# Patient Record
Sex: Female | Born: 1970 | Race: Black or African American | Hispanic: No | Marital: Married | Smoking: Never smoker
Health system: Southern US, Community
[De-identification: ages and names within clinical notes are randomized; demographics above are authoritative.]

## PROBLEM LIST (undated history)

## (undated) DIAGNOSIS — H3551 Vitreoretinal dystrophy: Secondary | ICD-10-CM

## (undated) DIAGNOSIS — J310 Chronic rhinitis: Secondary | ICD-10-CM

## (undated) DIAGNOSIS — E079 Disorder of thyroid, unspecified: Secondary | ICD-10-CM

## (undated) DIAGNOSIS — R0600 Dyspnea, unspecified: Secondary | ICD-10-CM

## (undated) DIAGNOSIS — Z973 Presence of spectacles and contact lenses: Secondary | ICD-10-CM

## (undated) DIAGNOSIS — M069 Rheumatoid arthritis, unspecified: Secondary | ICD-10-CM

## (undated) DIAGNOSIS — J398 Other specified diseases of upper respiratory tract: Secondary | ICD-10-CM

## (undated) HISTORY — PX: TRACHEOSTOMY CLOSURE: SHX458

## (undated) HISTORY — DX: Dyspnea, unspecified: R06.00

## (undated) HISTORY — DX: Rheumatoid arthritis, unspecified: M06.9

## (undated) HISTORY — PX: OTHER SURGICAL HISTORY: SHX169

## (undated) HISTORY — DX: Chronic rhinitis: J31.0

## (undated) HISTORY — PX: TRACHEAL DILITATION: SHX5068

## (undated) HISTORY — PX: TRACHEOSTOMY: SUR1362

---

## 2002-09-30 ENCOUNTER — Inpatient Hospital Stay (HOSPITAL_COMMUNITY): Admission: AD | Admit: 2002-09-30 | Discharge: 2002-09-30 | Payer: Self-pay | Admitting: *Deleted

## 2002-10-29 ENCOUNTER — Encounter: Admission: RE | Admit: 2002-10-29 | Discharge: 2002-10-29 | Payer: Self-pay | Admitting: *Deleted

## 2002-10-30 ENCOUNTER — Ambulatory Visit (HOSPITAL_COMMUNITY): Admission: RE | Admit: 2002-10-30 | Discharge: 2002-10-30 | Payer: Self-pay | Admitting: *Deleted

## 2002-10-31 ENCOUNTER — Encounter: Admission: RE | Admit: 2002-10-31 | Discharge: 2002-10-31 | Payer: Self-pay | Admitting: Internal Medicine

## 2002-11-04 ENCOUNTER — Inpatient Hospital Stay (HOSPITAL_COMMUNITY): Admission: AD | Admit: 2002-11-04 | Discharge: 2002-11-04 | Payer: Self-pay | Admitting: *Deleted

## 2002-11-26 ENCOUNTER — Encounter: Admission: RE | Admit: 2002-11-26 | Discharge: 2002-11-26 | Payer: Self-pay | Admitting: *Deleted

## 2002-12-09 ENCOUNTER — Encounter: Admission: RE | Admit: 2002-12-09 | Discharge: 2002-12-09 | Payer: Self-pay | Admitting: Internal Medicine

## 2002-12-10 ENCOUNTER — Encounter: Admission: RE | Admit: 2002-12-10 | Discharge: 2002-12-10 | Payer: Self-pay | Admitting: *Deleted

## 2002-12-10 ENCOUNTER — Ambulatory Visit (HOSPITAL_COMMUNITY): Admission: RE | Admit: 2002-12-10 | Discharge: 2002-12-10 | Payer: Self-pay | Admitting: *Deleted

## 2002-12-15 ENCOUNTER — Encounter: Admission: RE | Admit: 2002-12-15 | Discharge: 2002-12-15 | Payer: Self-pay | Admitting: *Deleted

## 2002-12-25 ENCOUNTER — Encounter: Admission: RE | Admit: 2002-12-25 | Discharge: 2002-12-25 | Payer: Self-pay | Admitting: Family Medicine

## 2002-12-25 ENCOUNTER — Ambulatory Visit (HOSPITAL_COMMUNITY): Admission: RE | Admit: 2002-12-25 | Discharge: 2002-12-25 | Payer: Self-pay | Admitting: *Deleted

## 2002-12-25 ENCOUNTER — Encounter: Admission: RE | Admit: 2002-12-25 | Discharge: 2002-12-25 | Payer: Self-pay | Admitting: *Deleted

## 2003-01-08 ENCOUNTER — Encounter: Admission: RE | Admit: 2003-01-08 | Discharge: 2003-01-08 | Payer: Self-pay | Admitting: Family Medicine

## 2003-01-09 ENCOUNTER — Encounter: Payer: Self-pay | Admitting: *Deleted

## 2003-01-09 ENCOUNTER — Ambulatory Visit (HOSPITAL_COMMUNITY): Admission: RE | Admit: 2003-01-09 | Discharge: 2003-01-09 | Payer: Self-pay | Admitting: *Deleted

## 2003-01-20 ENCOUNTER — Inpatient Hospital Stay (HOSPITAL_COMMUNITY): Admission: AD | Admit: 2003-01-20 | Discharge: 2003-01-20 | Payer: Self-pay | Admitting: *Deleted

## 2003-01-22 ENCOUNTER — Ambulatory Visit (HOSPITAL_COMMUNITY): Admission: RE | Admit: 2003-01-22 | Discharge: 2003-01-22 | Payer: Self-pay | Admitting: *Deleted

## 2003-01-22 ENCOUNTER — Encounter: Admission: RE | Admit: 2003-01-22 | Discharge: 2003-01-22 | Payer: Self-pay | Admitting: *Deleted

## 2003-02-05 ENCOUNTER — Ambulatory Visit (HOSPITAL_COMMUNITY): Admission: RE | Admit: 2003-02-05 | Discharge: 2003-02-05 | Payer: Self-pay | Admitting: *Deleted

## 2003-02-11 ENCOUNTER — Encounter: Admission: RE | Admit: 2003-02-11 | Discharge: 2003-02-11 | Payer: Self-pay | Admitting: Internal Medicine

## 2003-02-19 ENCOUNTER — Encounter: Admission: RE | Admit: 2003-02-19 | Discharge: 2003-02-19 | Payer: Self-pay | Admitting: *Deleted

## 2003-03-05 ENCOUNTER — Encounter: Admission: RE | Admit: 2003-03-05 | Discharge: 2003-03-05 | Payer: Self-pay | Admitting: *Deleted

## 2003-03-12 ENCOUNTER — Ambulatory Visit (HOSPITAL_COMMUNITY): Admission: RE | Admit: 2003-03-12 | Discharge: 2003-03-12 | Payer: Self-pay | Admitting: *Deleted

## 2003-03-12 ENCOUNTER — Encounter: Admission: RE | Admit: 2003-03-12 | Discharge: 2003-03-12 | Payer: Self-pay | Admitting: *Deleted

## 2003-03-26 ENCOUNTER — Encounter: Admission: RE | Admit: 2003-03-26 | Discharge: 2003-03-26 | Payer: Self-pay | Admitting: Family Medicine

## 2003-04-09 ENCOUNTER — Encounter: Admission: RE | Admit: 2003-04-09 | Discharge: 2003-04-09 | Payer: Self-pay | Admitting: *Deleted

## 2003-04-13 ENCOUNTER — Inpatient Hospital Stay (HOSPITAL_COMMUNITY): Admission: AD | Admit: 2003-04-13 | Discharge: 2003-04-13 | Payer: Self-pay | Admitting: Obstetrics & Gynecology

## 2003-04-21 ENCOUNTER — Inpatient Hospital Stay (HOSPITAL_COMMUNITY): Admission: AD | Admit: 2003-04-21 | Discharge: 2003-04-21 | Payer: Self-pay | Admitting: Obstetrics & Gynecology

## 2003-04-23 ENCOUNTER — Encounter: Admission: RE | Admit: 2003-04-23 | Discharge: 2003-04-23 | Payer: Self-pay | Admitting: *Deleted

## 2003-04-24 ENCOUNTER — Inpatient Hospital Stay (HOSPITAL_COMMUNITY): Admission: AD | Admit: 2003-04-24 | Discharge: 2003-04-27 | Payer: Self-pay | Admitting: Family Medicine

## 2003-05-26 ENCOUNTER — Encounter: Admission: RE | Admit: 2003-05-26 | Discharge: 2003-05-26 | Payer: Self-pay | Admitting: Internal Medicine

## 2003-08-27 ENCOUNTER — Encounter: Admission: RE | Admit: 2003-08-27 | Discharge: 2003-08-27 | Payer: Self-pay | Admitting: Internal Medicine

## 2003-09-07 ENCOUNTER — Encounter: Admission: RE | Admit: 2003-09-07 | Discharge: 2003-09-07 | Payer: Self-pay | Admitting: Internal Medicine

## 2003-10-12 ENCOUNTER — Encounter: Admission: RE | Admit: 2003-10-12 | Discharge: 2003-10-12 | Payer: Self-pay | Admitting: Internal Medicine

## 2003-11-03 ENCOUNTER — Encounter: Admission: RE | Admit: 2003-11-03 | Discharge: 2004-02-01 | Payer: Self-pay | Admitting: Internal Medicine

## 2003-12-19 ENCOUNTER — Emergency Department (HOSPITAL_COMMUNITY): Admission: EM | Admit: 2003-12-19 | Discharge: 2003-12-19 | Payer: Self-pay | Admitting: Emergency Medicine

## 2004-02-02 ENCOUNTER — Encounter: Admission: RE | Admit: 2004-02-02 | Discharge: 2004-05-02 | Payer: Self-pay | Admitting: Internal Medicine

## 2004-02-25 ENCOUNTER — Ambulatory Visit: Payer: Self-pay | Admitting: Internal Medicine

## 2005-08-29 ENCOUNTER — Emergency Department (HOSPITAL_COMMUNITY): Admission: EM | Admit: 2005-08-29 | Discharge: 2005-08-29 | Payer: Self-pay | Admitting: Emergency Medicine

## 2006-08-07 ENCOUNTER — Emergency Department (HOSPITAL_COMMUNITY): Admission: EM | Admit: 2006-08-07 | Discharge: 2006-08-07 | Payer: Self-pay | Admitting: Family Medicine

## 2007-02-26 ENCOUNTER — Emergency Department (HOSPITAL_COMMUNITY): Admission: EM | Admit: 2007-02-26 | Discharge: 2007-02-26 | Payer: Self-pay | Admitting: Emergency Medicine

## 2007-08-05 ENCOUNTER — Encounter: Admission: RE | Admit: 2007-08-05 | Discharge: 2007-08-05 | Payer: Self-pay | Admitting: Obstetrics and Gynecology

## 2007-10-05 ENCOUNTER — Emergency Department (HOSPITAL_COMMUNITY): Admission: EM | Admit: 2007-10-05 | Discharge: 2007-10-05 | Payer: Self-pay | Admitting: Family Medicine

## 2007-10-22 ENCOUNTER — Encounter: Payer: Self-pay | Admitting: Internal Medicine

## 2007-10-22 ENCOUNTER — Ambulatory Visit (HOSPITAL_COMMUNITY): Admission: RE | Admit: 2007-10-22 | Discharge: 2007-10-22 | Payer: Self-pay | Admitting: Internal Medicine

## 2007-11-27 ENCOUNTER — Encounter: Admission: RE | Admit: 2007-11-27 | Discharge: 2007-11-27 | Payer: Self-pay | Admitting: Rheumatology

## 2009-04-06 ENCOUNTER — Ambulatory Visit: Payer: Self-pay | Admitting: Internal Medicine

## 2009-04-06 DIAGNOSIS — M069 Rheumatoid arthritis, unspecified: Secondary | ICD-10-CM | POA: Insufficient documentation

## 2009-04-06 DIAGNOSIS — R0602 Shortness of breath: Secondary | ICD-10-CM | POA: Insufficient documentation

## 2009-04-06 LAB — CONVERTED CEMR LAB
CO2: 26 meq/L (ref 19–32)
Chloride: 106 meq/L (ref 96–112)
Creatinine, Ser: 0.6 mg/dL (ref 0.4–1.2)
Glucose, Bld: 94 mg/dL (ref 70–99)
Potassium: 3.8 meq/L (ref 3.5–5.1)

## 2009-04-09 ENCOUNTER — Ambulatory Visit: Payer: Self-pay | Admitting: Cardiology

## 2009-04-10 DIAGNOSIS — J45909 Unspecified asthma, uncomplicated: Secondary | ICD-10-CM | POA: Insufficient documentation

## 2009-04-21 ENCOUNTER — Telehealth: Payer: Self-pay | Admitting: Internal Medicine

## 2009-04-29 ENCOUNTER — Ambulatory Visit: Payer: Self-pay | Admitting: Internal Medicine

## 2009-04-30 ENCOUNTER — Telehealth (INDEPENDENT_AMBULATORY_CARE_PROVIDER_SITE_OTHER): Payer: Self-pay | Admitting: *Deleted

## 2009-11-09 ENCOUNTER — Inpatient Hospital Stay (HOSPITAL_COMMUNITY): Admission: AD | Admit: 2009-11-09 | Discharge: 2009-11-09 | Payer: Self-pay | Admitting: Obstetrics & Gynecology

## 2009-11-09 ENCOUNTER — Encounter: Payer: Self-pay | Admitting: Internal Medicine

## 2009-11-09 DIAGNOSIS — O9989 Other specified diseases and conditions complicating pregnancy, childbirth and the puerperium: Secondary | ICD-10-CM

## 2009-11-09 DIAGNOSIS — R05 Cough: Secondary | ICD-10-CM

## 2009-11-09 DIAGNOSIS — M940 Chondrocostal junction syndrome [Tietze]: Secondary | ICD-10-CM

## 2009-11-09 DIAGNOSIS — O99891 Other specified diseases and conditions complicating pregnancy: Secondary | ICD-10-CM

## 2009-11-09 DIAGNOSIS — R059 Cough, unspecified: Secondary | ICD-10-CM

## 2009-11-18 ENCOUNTER — Encounter: Admission: RE | Admit: 2009-11-18 | Discharge: 2009-11-18 | Payer: Self-pay | Admitting: Obstetrics and Gynecology

## 2009-12-07 ENCOUNTER — Encounter: Payer: Self-pay | Admitting: Internal Medicine

## 2010-01-21 ENCOUNTER — Telehealth: Payer: Self-pay | Admitting: Internal Medicine

## 2010-01-21 ENCOUNTER — Ambulatory Visit: Payer: Self-pay | Admitting: Internal Medicine

## 2010-01-27 ENCOUNTER — Encounter: Payer: Self-pay | Admitting: Internal Medicine

## 2010-01-27 ENCOUNTER — Telehealth (INDEPENDENT_AMBULATORY_CARE_PROVIDER_SITE_OTHER): Payer: Self-pay | Admitting: *Deleted

## 2010-01-31 ENCOUNTER — Ambulatory Visit: Payer: Self-pay | Admitting: Internal Medicine

## 2010-02-10 ENCOUNTER — Encounter: Payer: Self-pay | Admitting: Internal Medicine

## 2010-02-11 ENCOUNTER — Ambulatory Visit: Payer: Self-pay | Admitting: Internal Medicine

## 2010-02-11 ENCOUNTER — Telehealth (INDEPENDENT_AMBULATORY_CARE_PROVIDER_SITE_OTHER): Payer: Self-pay | Admitting: *Deleted

## 2010-02-11 DIAGNOSIS — J310 Chronic rhinitis: Secondary | ICD-10-CM | POA: Insufficient documentation

## 2010-02-11 DIAGNOSIS — J386 Stenosis of larynx: Secondary | ICD-10-CM | POA: Insufficient documentation

## 2010-02-23 ENCOUNTER — Encounter: Payer: Self-pay | Admitting: Internal Medicine

## 2010-02-28 ENCOUNTER — Telehealth (INDEPENDENT_AMBULATORY_CARE_PROVIDER_SITE_OTHER): Payer: Self-pay | Admitting: *Deleted

## 2010-03-23 ENCOUNTER — Ambulatory Visit: Payer: Self-pay | Admitting: Internal Medicine

## 2010-03-28 ENCOUNTER — Encounter: Payer: Self-pay | Admitting: Internal Medicine

## 2010-04-03 ENCOUNTER — Inpatient Hospital Stay (HOSPITAL_COMMUNITY): Admission: AD | Admit: 2010-04-03 | Discharge: 2010-04-08 | Payer: Self-pay | Admitting: Obstetrics & Gynecology

## 2010-04-03 ENCOUNTER — Ambulatory Visit: Payer: Self-pay | Admitting: Internal Medicine

## 2010-04-06 ENCOUNTER — Encounter (INDEPENDENT_AMBULATORY_CARE_PROVIDER_SITE_OTHER): Payer: Self-pay | Admitting: Obstetrics and Gynecology

## 2010-04-11 ENCOUNTER — Telehealth (INDEPENDENT_AMBULATORY_CARE_PROVIDER_SITE_OTHER): Payer: Self-pay | Admitting: *Deleted

## 2010-04-15 ENCOUNTER — Ambulatory Visit: Payer: Self-pay | Admitting: Internal Medicine

## 2010-05-12 ENCOUNTER — Ambulatory Visit (HOSPITAL_COMMUNITY)
Admission: RE | Admit: 2010-05-12 | Discharge: 2010-05-12 | Payer: Self-pay | Source: Home / Self Care | Attending: Otolaryngology | Admitting: Otolaryngology

## 2010-05-20 ENCOUNTER — Ambulatory Visit: Payer: Self-pay | Admitting: Internal Medicine

## 2010-06-21 NOTE — Assessment & Plan Note (Signed)
Summary: ASTHMA///kp   Primary Provider/Referring Provider:  Allyne Gee  CC:  Asthma flare up.  History of Present Illness: April 29, 2009- Question upper Airway Obstruction, asthma, RA One episode wheezey with anxiety- her rescue inhaler was definite help. Just restarted singulair for scratchy throat. Has an annoying cough ongoing, mostly at night, but no acute infection. Cough wakes her every night- takes drink of water. Not aware of reflux and the right chest pains mentioned last time have resolved. CT neck was of neck for anatomic obstruction. Lungs were clear, but axillary adenopathy noted. She denies reflux, choking or strangling with food or drink. Got flu vax. Repeat PFT on return, to look again for squared FV loops?  January 21, 2010- ? Upper airway obstruction, Asthma, RA Now [redacted] weeks pregnant. Acute visit - has been struggling with her breathing for 3 months. Went to ER 2 months ago at [facility]. Did CXR then- Again discused normal CT chest and neck from last Fall. She saw Dr Narda Bonds who did laryngoscopy- no bx. He ?'d sarcoid. "Saw swelling", recommended nasal saline rinse. Ran out of Qvar and Singulair - made no difference. Weather changes,  strong odors irritate her nose and throat and make breathing worse. Hot weather was unbearable. She feels the heaviness at lower right sternum. No dysphagia. does get hoarse. Denies heart burn or reflux. Ok lying down with no change in her breathing, but in the evening she does keep a barking cough that interferes with sleep but is nonproductive and nonpositional. Proair does help.     Asthma History    Initial Asthma Severity Rating:    Age range: 12+ years    Symptoms: 0-2 days/week    Nighttime Awakenings: 3-4/month    Interferes w/ normal activity: minor limitations    SABA use (not for EIB): >2 days/week but not >1X/day    Asthma Severity Assessment: Mild Persistent   Preventive Screening-Counseling &  Management  Alcohol-Tobacco     Smoking Status: never  Current Medications (verified): 1)  Synthroid 137 Mcg Tabs (Levothyroxine Sodium) .... Take 1 By Mouth Once Daily 2)  Proair Hfa 108 (90 Base) Mcg/act Aers (Albuterol Sulfate) .... 2 Puffs Four Times A Day As Needed Rescue 3)  Albuterol Sulfate (2.5 Mg/32ml) 0.083% Nebu (Albuterol Sulfate) .Marland Kitchen.. 1 Vial Four Times A Day As Needed 4)  Claritin 10 Mg Tabs (Loratadine) .... Take 1 By Mouth Once Daily 5)  Ayr Saline Nasal Rinse 1.57 Gm Pack (Sodium Chloride-Sodium Bicarb) .... Use As Directed 6)  Vicks Vaporub 5.2-1.2-2.8 % Crea (Camphor-Eucalyptus-Menthol) .... Use As Directed  Allergies (verified): 1)  ! 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  Works- Teaching laboratory technician  Risk Factors: Smoking Status: never (01/21/2010)  Review of Systems      See HPI       The patient complains of hoarseness, dyspnea on exertion, and prolonged cough.  The patient denies anorexia, fever, weight loss, weight gain, vision loss, decreased hearing, chest pain, syncope, peripheral edema, headaches, hemoptysis, abdominal pain, and severe indigestion/heartburn.    Vital Signs:  Patient profile:   40 year old female Height:      65 inches Weight:      169.25 pounds BMI:  28.27 O2 Sat:      98 % on Room air Pulse rate:   99 / minute BP sitting:   112 / 64  (right arm) Cuff size:   regular  Vitals Entered By: Reynaldo Minium CMA (January 21, 2010 11:23 AM)  O2 Flow:  Room air CC: Asthma flare up   Physical Exam  Additional Exam:  General: A/Ox3; pleasant and cooperative, NAD, SKIN: no rash,  lesions NODES: palpable nodes right> left axilla HEENT: [place]/AT, EOM- WNL, Conjuctivae- clear, PERRLA, TM-WNL, Nose- clear, Throat- clear and wnl, Malampatti II-III, normal voice or minimally hoarse. Inspiratory dragging respiratory qualityduring inspiration. NECK: Supple w/ fair ROM, JVD- none, normal carotid impulses w/o bruits Thyroid- not enlarged,  CHEST: Clear to P&A HEART: RRR, no m/g/r heard ABDOMEN: Soft and nl;  ZOX:WRUE, nl pulses, no edema  NEURO: Grossly intact to observation      Impression & Recommendations:  Problem # 1:  ASTHMA (ICD-493.90) She responds to Proair, indicating there is some reversibility, but I still believe most of her problem is not asthma. Dr Ezzard Standing didn't see a rheumatoid change to her cords, but brought up issue of sarcoid so we will send an ACE level. Will do an office spirometry to look at loop. Will get ONOx. Add Alvesco 80. She will probably need bronchoscopy after delivery to look for tracheal narrowing.  Problem # 2:  DYSPNEA (ICD-786.05) Upper airway obstruction at upper trachea may be present. ENT exam didn't recognize arytenoid arthritis or laryngeal stenosis.  Medications Added to Medication List This Visit: 1)  Albuterol Sulfate (2.5 Mg/55ml) 0.083% Nebu (Albuterol sulfate) .Marland Kitchen.. 1 vial four times a day as needed 2)  Claritin 10 Mg Tabs (Loratadine) .... Take 1 by mouth once daily 3)  Ayr Saline Nasal Rinse 1.57 Gm Pack (Sodium chloride-sodium bicarb) .... Use as directed 4)  Vicks Vaporub 5.2-1.2-2.8 % Crea (Camphor-eucalyptus-menthol) .... Use as directed 5)  Alvesco 80 Mcg/act Aers (Ciclesonide) .... 2 puffs and rinse mouth twice daily.  Other Orders: Nebulizer Tx (45409) T-Angiotensin i-Converting Enzyme ([id number]) Est. Patient Level IV (56213) DME Referral (DME)  Patient Instructions: 1)  We will ask record from Dr Narda Bonds 2)  CC Dr Ezzard Standing 3)        [facility]ilton [facility] 4)  Lab 5)  [facility] will set up overnight  oximetry 6)  sample/ script Alvesco 80: 2 puffs and rinse mouth, twice daily every day. Continue to use Proair as a rescue inhaler 7)  Please schedule a follow-up appointment in 3 weeks. Prescriptions: ALVESCO 80 MCG/ACT AERS (CICLESONIDE) 2 puffs and rinse mouth twice daily.  #1 x prn   Entered and Authorized by:   Waymon Budge MD   Signed by:   Waymon Budge MD on 01/21/2010   Method used:   Print then Give to Patient   RxID:   [id number]      Medication Administration  Medication # 1:    Medication: Xopenex 1.25mg     Diagnosis: ASTHMA (ICD-493.90)    Dose: 1 vial     Route: inhaled    Exp Date: 01/2010    Lot #: [id number]    Mfr: Sepracor    Patient tolerated medication without complications    Given by: Reynaldo Minium CMA (January 21, 2010 11:26 AM)  Orders Added: 1)  Nebulizer Tx [02725] 2)  T-Angiotensin i-Converting Enzyme [[id number]] 3)  Est. Patient Level IV [25956] 4)  DME Referral [DME]

## 2010-06-21 NOTE — Letter (Signed)
Summary: Out of Work  [employer]  [address]   [place], [place] [address]   Phone: [phone]  Fax: [phone]    February 11, 2010   Employee:  Laurie Dyer    To Whom It May Concern:   For Medical reasons, please excuse the above named employee for being late to work.  Start:   February 11, 2010  End:   February 11, 2010  Laurie Dyer is able to return to work today. If you need additional information, please feel free to contact our office.         Sincerely,     Jason Coop

## 2010-06-21 NOTE — Procedures (Signed)
Summary: Oximetry/[facility]  Oximetry/[facility]   Imported By: Sherian Rein 02/10/2010 11:33:37  _____________________________________________________________________  External Attachment:    Type:   Image     Comment:   External Document

## 2010-06-21 NOTE — Assessment & Plan Note (Signed)
Summary: rov 3 wks ///kp   Primary Provider/Referring Provider:  Allyne Gee  CC:  3 week follow up visit-asthma; Still increased wheezing.Marland Kitchen  History of Present Illness:  January 21, 2010- ? Upper airway obstruction, Asthma, RA Now [redacted] weeks pregnant. Acute visit - has been struggling with her breathing for 3 months. Went to ER 2 months ago at [facility]. Did CXR then- Again discused normal CT chest and neck from last Fall. She saw Dr Narda Bonds who did laryngoscopy- no bx. He ?'d sarcoid. "Saw swelling", recommended nasal saline rinse. Ran out of Qvar and Singulair - made no difference. Weather changes,  strong odors irritate her nose and throat and make breathing worse. Hot weather was unbearable. She feels the heaviness at lower right sternum. No dysphagia. does get hoarse. Denies heart burn or reflux. Ok lying down with no change in her breathing, but in the evening she does keep a barking cough that interferes with sleep but is nonproductive and nonpositional. Proair does help.  February 11, 2010- Upper airway obstruction, Asthma, RA.................Marland Kitchenhusband here I reviewed her CT's with Dr Karin Golden in radiology, recognizing apparently normal cords, but circumferential narrowing of the subglottic trachea. He doubted neoplasm. Sarcoidoss would be possible.  Some nights she wakes coughing then gets smothered. Comfortable sitting quietly.  She is using the rescue inhaler now about three times a day and it does give definite relief, especially some days when "ragweed is up", Nose is always itchy with sneezing.  Around 2008 she was living in apt with mold and a lot of trees. That was when she began noting rhinitis and chest  tightness. No hx of airway intubation.  Now 28 weeks- Dr Henderson Cloud. PFTs- confirm fixed upper airway obstruction. ONOX was normal. I reviewed these results, her negative ACE level, and Dr Allene Pyo observations from 12/07/09 rhinoscopy/ laryngoscopy noting mucosal crusting in the  nose, normal cords, subglottic swelling.    Asthma History    Asthma Control Assessment:    Age range: 12+ years    Symptoms: throughout the day    Nighttime Awakenings: 0-2/month    Interferes w/ normal activity: no limitations    SABA use (not for EIB): >2 days/week    Asthma Control Assessment: Very Poorly Controlled   Preventive Screening-Counseling & Management  Alcohol-Tobacco     Smoking Status: never  Current Medications (verified): 1)  Synthroid 137 Mcg Tabs (Levothyroxine Sodium) .... Take 1 By Mouth Once Daily 2)  Proair Hfa 108 (90 Base) Mcg/act Aers (Albuterol Sulfate) .... 2 Puffs Four Times A Day As Needed Rescue 3)  Albuterol Sulfate (2.5 Mg/47ml) 0.083% Nebu (Albuterol Sulfate) .Marland Kitchen.. 1 Vial Four Times A Day As Needed 4)  Claritin 10 Mg Tabs (Loratadine) .... Take 1 By Mouth Once Daily 5)  Ayr Saline Nasal Rinse 1.57 Gm Pack (Sodium Chloride-Sodium Bicarb) .... Use As Directed 6)  Vicks Vaporub 5.2-1.2-2.8 % Crea (Camphor-Eucalyptus-Menthol) .... Use As Directed 7)  Alvesco 80 Mcg/act Aers (Ciclesonide) .... 2 Puffs and Rinse Mouth Twice Daily.  Allergies (verified): 1)  ! Sulfa  Past History:  Past Medical History: Last updated: 04/06/2009 ASTHMA (ICD-493.90) ARTHRITIS, RHEUMATOID (ICD-714.0) DYSPNEA (ICD-786.05) Hypothyroid s/p RAI  Family History: Last updated: 04/06/2009 Grandmother- had asthma  Social History: Last updated: 04/06/2009 married, daughter Patient never smoked.  Works- Teaching laboratory technician  Risk Factors: Smoking Status: never (02/11/2010)  Past Surgical History: Rhinoscopy, laryngoscopy -Dr Narda Bonds 12/07/09  Review of Systems      See HPI  The patient complains of weight gain, dyspnea on exertion, and prolonged cough.  The patient denies anorexia, fever, weight loss, vision loss, decreased hearing, hoarseness, chest pain, syncope, peripheral edema, headaches, hemoptysis, abdominal pain, melena, severe  indigestion/heartburn, suspicious skin lesions, transient blindness, abnormal bleeding, enlarged lymph nodes, and angioedema.    Vital Signs:  Patient profile:   40 year old female Height:      65 inches Weight:      172 pounds BMI:     28.73 O2 Sat:      98 % on Room air Pulse rate:   102 / minute BP sitting:   142 / 64  (left arm) Cuff size:   regular  Vitals Entered By: Reynaldo Minium CMA (February 11, 2010 9:10 AM)  O2 Flow:  Room air CC: 3 week follow up visit-asthma; Still increased wheezing.   Physical Exam  Additional Exam:  General: A/Ox3; pleasant and cooperative, NAD, Pregnant SKIN: no rash, lesions NODES: palpable nodes right> left axilla HEENT: [place]/AT, EOM- WNL, Conjuctivae- clear, PERRLA, TM-WNL, Nose- clear, Throat- clear and wnl, Malampatti II-III, normal voice or minimally hoarse. 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 Noisy respiration with exertion, but normal oxygenation.  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 We discused options. I will speak to her gyn. Meanwhile, we will send her back to Dr Ezzard Standing to consider a nasal mucosal bx which may suppoort dx of sarcoid.   I  today called and discussed with Dr Henderson Cloud: Can expect noisy respiration and increased work of breathing during labor. If necesary, I would expect Anesthesiology  to be able to bypass her airway narrowing with an endotracheal tube, if necessary, as possibly for a C-section. If we had a nasal mucosal bx c/w sarcoid, then we could consider initiating steroid  therapy to see if that would shrink the soft tissue swelling in the trachea. Dr Henderson Cloud idicates this therapy could be used at this pint in pregnancy if needed. I would not expect quick response.  Problem # 2:  ASTHMA (ICD-493.90) Her inhalers give relief, so there is some component of bronchospasm. I've told  her she may continue to use her aerosol meds as directed.    Problem # 3:  CHRONIC RHINITIS (ICD-472.0)  Some persistent stuffiness and crusting. This may be atopic or sarcoid. She can use nasal strips and saline for now. Hopefully a nasal mucosal bx would show granulomas so at least we would have a dx pertinent to her subglottic stenosis.   Orders: Est. Patient Level IV (45409)  Other Orders: ENT Referral (ENT)  Patient Instructions: 1)  Please schedule a follow-up appointment in 1 month 2)  See [facility] to set up referral to Dr Ezzard Standing for possible biopsy. 3)  I will call your G'yN to review your status now, with this  4)       "tracheal stenosis" 5)  cc Dr Henderson Cloud, Dr Marvetta Gibbons

## 2010-06-21 NOTE — Miscellaneous (Signed)
Summary: Orders Update pft charges  Clinical Lists Changes  Orders: Added new Service order of Spirometry w/Graph (94010) - Signed 

## 2010-06-21 NOTE — Miscellaneous (Signed)
Summary: CPAP / [facility]  CPAP / [facility]   Imported By: Lennie Odor 02/17/2010 15:22:15  _____________________________________________________________________  External Attachment:    Type:   Image     Comment:   External Document

## 2010-06-21 NOTE — Progress Notes (Signed)
Summary: WANTS HFU W/ DR YOUNG  Phone Note Call from Patient   Caller: [facility] Call For: YOUNG Summary of Call: REQUESTS TO SPEAK TO NURSE RE: SCHEDULING A HFU ASAP. [phone] Initial call taken by: Tivis Ringer, CNA,  April 11, 2010 2:33 PM  Follow-up for Phone Call        CD[facility] had opening for 3 pm on 11/22 so I went ahead and sched this appt for pt and LMOVM for her TCB and make sure this will work for her. Follow-up by: Vernie Murders,  April 11, 2010 3:49 PM  Additional Follow-up for Phone Call Additional follow up Details #1::        Appt for today has been rescheduled for 05/20/10.   Called, spoke with pt's husband.  Per Earl Lites, pt has appt at 2:45 today with ENT.  Dr. Maple [facility], pls advise if pt can be worked in prior to 12/30 as she was discharged yesterday for delivery and had trach placed as well.  Thanks!  Additional Follow-up by: Gweneth Dimitri RN,  April 12, 2010 12:33 PM    Additional Follow-up for Phone Call Additional follow up Details #2::    Spoke with Gregory(husband) pt to be here Friday at 1115am for 1130am appt.Reynaldo Minium CMA  April 13, 2010 12:15 PM

## 2010-06-21 NOTE — Assessment & Plan Note (Signed)
Summary: rov 1 month ///kp   Primary Provider/Referring Provider:  Allyne Gee  CC:  1 month follow up visit-asthma and stenosis of larynx.  History of Present Illness:  February 11, 2010- Upper airway obstruction, Asthma, RA.................Marland Kitchenhusband here I reviewed her CT's with Dr Karin Golden in radiology, recognizing apparently normal cords, but circumferential narrowing of the subglottic trachea. He doubted neoplasm. Sarcoidoss would be possible.  Some nights she wakes coughing then gets smothered. Comfortable sitting quietly.  She is using the rescue inhaler now about three times a day and it does give definite relief, especially some days when "ragweed is up", Nose is always itchy with sneezing.  Around 2008 she was living in apt with mold and a lot of trees. That was when she began noting rhinitis and chest  tightness. No hx of airway intubation.  Now 28 weeks- Dr Henderson Cloud. PFTs- confirm fixed upper airway obstruction. ONOX was normal. I reviewed these results, her negative ACE level, and Dr Allene Pyo observations from 12/07/09 rhinoscopy/ laryngoscopy noting mucosal crusting in the nose, normal cords, subglottic swelling  March 23, 2010 Upper airway obstruction/ subglottic stenosis, Hx Asthma, RA....................Marland Kitchenhusband here.  Nurse-CC: 1 month follow up visit-asthma and stenosis of larynx Nasal mucosal biopsy by Dr Ezzard Standing showed only nonspecific inflammation and crusting.  ACE was WNL at 26, not indicative of active sarcoid.  Pregnancy going well- due date December 15.  Notes more dry cough and some throat clearing, with postnasal drip .Hacking cough keeps her up at night. Denies reflux/ heartburn. Husband says she is complaining of dyspnea and breathing discomfort.They called EMT 2-3 weeks ago - came and administered oxygen. She gets episodes of dyspnea, throat seems tight, anxiety "afraid to sleep". Continues Qvar. Uses proair several times daily, admittedly mostly when phlegm is in throat  or she feels anxious- not for wheezing-  it helps "some".   Asthma History    Asthma Control Assessment:    Age range: 12+ years    Symptoms: 0-2 days/week    Nighttime Awakenings: 0-2/month    Interferes w/ normal activity: no limitations    SABA use (not for EIB): several times per day    Asthma Control Assessment: Very Poorly Controlled   Preventive Screening-Counseling & Management  Alcohol-Tobacco     Smoking Status: never  Current Medications (verified): 1)  Synthroid 137 Mcg Tabs (Levothyroxine Sodium) .... Take 1 By Mouth Once Daily 2)  Proair Hfa 108 (90 Base) Mcg/act Aers (Albuterol Sulfate) .... 2 Puffs Four Times A Day As Needed Rescue 3)  Albuterol Sulfate (2.5 Mg/67ml) 0.083% Nebu (Albuterol Sulfate) .Marland Kitchen.. 1 Vial Four Times A Day As Needed 4)  Claritin 10 Mg Tabs (Loratadine) .... Take 1 By Mouth Once Daily 5)  Ayr Saline Nasal Rinse 1.57 Gm Pack (Sodium Chloride-Sodium Bicarb) .... Use As Directed 6)  Vicks Vaporub 5.2-1.2-2.8 % Crea (Camphor-Eucalyptus-Menthol) .... Use As Directed 7)  Qvar 80 Mcg/act Aers (Beclomethasone Dipropionate) .... 2 Puffs and Rinse Mouth, Once Daily  Allergies (verified): 1)  ! Sulfa  Past History:  Family History: Last updated: 03/23/2010 Grandmother- had asthma Parents living  Social History: Last updated: 04/06/2009 married, daughter Patient never smoked.  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                                                -CT neck- addendum on review by radiology notes circumferential mucosal and                                                       submucosal thickening - possible sarcoid                                               -ACE level 26 (8-52) 01/21/10 Hypothyroid s/p RAI  Past Surgical History: Rhinoscopy, laryngoscopy -Dr Narda Bonds 12/07/09 Nasal mucosal bx- Dr Ezzard Standing- benign crusting 02/23/10  Family History: Grandmother- had asthma Parents living  Review of Systems      See HPI       The patient complains of dyspnea on exertion and prolonged cough.  The patient denies anorexia, fever, weight loss, vision loss, decreased hearing, hoarseness, chest pain, syncope, peripheral edema, headaches, hemoptysis, abdominal pain, melena, severe indigestion/heartburn, muscle weakness, abnormal bleeding, enlarged lymph nodes, and angioedema.    Vital Signs:  Patient profile:   40 year old female Height:      65 inches Weight:      176.25 pounds BMI:     29.44 O2 Sat:      99 % on Room air Pulse rate:   77 / minute BP sitting:   122 / 74  (left arm) Cuff size:   regular  Vitals Entered By: Reynaldo Minium CMA (March 23, 2010 9:00 AM)  O2 Flow:  Room air CC: 1 month follow up visit-asthma and stenosis of larynx   Physical Exam  Additional Exam:  General: A/Ox3; pleasant and cooperative, NAD, Pregnant SKIN: no rash, lesions NODES: palpable nodes right> left axilla HEENT: [place]/AT, EOM- WNL, Conjuctivae- clear, PERRLA, TM-WNL, Nose- clear, Throat- clear and wnl, Malampatti II-III, normal voice or minimally hoarse. 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 I can't make dx of sarcoid but that  still seems plausible. She does not seem to have rheumatoid stiffening of vocal cords. This process, visible on CT, seems unrelated. . We are going to try prednisone now, late in 3rd trimester, and recheck overnight oximetry.  Could this have resulted from the RAI given to treat thyroid in past?? I have called and spoken to Dr Henderson Cloud previously, so her Ob physicians would be prepared. If she is in distress from work of breathing in labor, then she can be intubated and delivered by C-section.  Problem # 2:  ASTHMA (ICD-493.90) She gets some relief from her rescue inhaler, but I don't hear wheezing and this is mostly not asthma.   Problem #  3:  ARTHRITIS, RHEUMATOID (ICD-714.0) I don't know of a rheumatoid pattern like what we are seeing. Dr Ezzard Standing has not identified rheumatoid stiffness at larynx.  Medications Added to Medication List This Visit: 1)  Prednisone 10 Mg Tabs (Prednisone) .Marland Kitchen.. 1 tab four times daily x 2 days, 3 times daily x 2 days, 2 times daily x 2 days, 1 time daily x 2 days  Other Orders: Est. Patient Level IV (13086) DME Referral (DME)  Patient Instructions: 1)  Please schedule a follow-up appointment in 2 weeks. 2)  Script for prednisone sent to drug store 3)  See [facility] to set up overnight oximetry 4)  cc Dr Henderson Cloud, Dr Allyne Gee Prescriptions: PREDNISONE 10 MG TABS (PREDNISONE) 1 tab four times daily x 2 days, 3 times daily x 2 days, 2 times daily x 2 days, 1 time daily x 2 days  #20 x 0   Entered and Authorized by:   Waymon Budge MD   Signed by:   Waymon Budge MD on 03/23/2010   Method used:   Electronically to        [facility] [phone]* (retail)       [address]       [place], [place]  [address]       Ph: [phone] or [phone]       Fax: [phone]   RxID:   [id number]

## 2010-06-21 NOTE — Progress Notes (Signed)
Summary: ONO that dr young ordered asap  Phone Note From Other Clinic   Caller: Shanda Bumps w/ [facility] Call For: young Summary of Call: per nurse w/ [facility]: pt is to have ONO asap (pt is pregnant). per Shanda Bumps w/ [facility], this will not be delivered to pt until mon 9/5 w/ results for dr young on tues 9/6. jessica # [phone] Initial call taken by: Tivis Ringer, CNA,  January 21, 2010 5:05 PM  Follow-up for Phone Call        Dr. Clelia Croft with [facility] calling to let you know the order for ONO was sent as ASAP.  It will be delivered on Monday with pick up and results scheduled for Tuesday.  Only needing call back if this is not ok.  Thanks! Follow-up by: Gweneth Dimitri RN,  January 21, 2010 5:10 PM

## 2010-06-21 NOTE — Letter (Signed)
Summary: Consult/[place]  Consult/[place]   Imported By: Sherian Rein 04/07/2010 09:00:51  _____________________________________________________________________  External Attachment:    Type:   Image     Comment:   External Document

## 2010-06-21 NOTE — Assessment & Plan Note (Signed)
Summary: follow up trach placed/kcw   Primary Sujey Gundry/Referring Sheretta Grumbine:  Allyne Gee  CC:  Follow up visit after giving birth and had trach placed.Marland Kitchen  History of Present Illness: March 23, 2010 Upper airway obstruction/ subglottic stenosis, Hx Asthma, RA....................Marland Kitchenhusband here.  Nurse-CC: 1 month follow up visit-asthma and stenosis of larynx Nasal mucosal biopsy by Dr Ezzard Standing showed only nonspecific inflammation and crusting.  ACE was WNL at 26, not indicative of active sarcoid.  Pregnancy going well- due date December 15.  Notes more dry cough and some throat clearing, with postnasal drip .Hacking cough keeps her up at night. Denies reflux/ heartburn. Husband says she is complaining of dyspnea and breathing discomfort.They called EMT 2-3 weeks ago - came and administered oxygen. She gets episodes of dyspnea, throat seems tight, anxiety "afraid to sleep". Continues Qvar. Uses proair several times daily, admittedly mostly when phlegm is in throat or she feels anxious- not for wheezing-  it helps "some".   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 Denies hx of infection or external irradiation. Speaking well w/ PMV. Bx was nonspecific, like old scarring. Don't know of any reason for this area to scar. Discused f/u for this w/ Dr Ezzard Standing. Ultimately will need to address the stenosis ? laser, ? second opinion. Feels she can cough well enough to clear her chest. No problems for them w/ cleaning iner cannula. She had baby vaginally at [facility] after trach done.    Asthma History    Asthma Control Assessment:    Age range: 12+ years    Symptoms: 0-2 days/week    Nighttime Awakenings: 0-2/month    Interferes w/ normal activity: no limitations    SABA use (not for EIB): 0-2 days/week    Asthma Control Assessment: Well Controlled   Preventive Screening-Counseling &  Management  Alcohol-Tobacco     Smoking Status: never  Current Medications (verified): 1)  Synthroid 150 Mcg Tabs (Levothyroxine Sodium) .... Take 1 By Mouth Once Daily 2)  Proair Hfa 108 (90 Base) Mcg/act Aers (Albuterol Sulfate) .... 2 Puffs Four Times A Day As Needed Rescue 3)  Albuterol Sulfate (2.5 Mg/3ml) 0.083% Nebu (Albuterol Sulfate) .Marland Kitchen.. 1 Vial Four Times A Day As Needed 4)  Ayr Saline Nasal Rinse 1.57 Gm Pack (Sodium Chloride-Sodium Bicarb) .... Use As Directed 5)  Qvar 80 Mcg/act Aers (Beclomethasone Dipropionate) .... 2 Puffs and Rinse Mouth, Once Daily  Allergies (verified): 1)  ! 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                                                -CT neck- addendum on review by radiology notes circumferential mucosal and                                                      submucosal thickening - possible sarcoid                                               -  ACE level 26 (8-52) 01/21/10                                                -tracheostomy- Dr C. Newman Hypothyroid s/p RAI  Past Surgical History: Rhinoscopy, laryngoscopy -Dr Narda Bonds 12/07/09 Nasal mucosal bx- Dr Ezzard Standing- benign crusting 02/23/10 Tracheostomy w/ tracheal biopsy.2011  Review of Systems      See HPI  The patient denies shortness of breath with activity, shortness of breath at rest, productive cough, non-productive cough, coughing up blood, chest pain, irregular heartbeats, acid heartburn, indigestion, loss of appetite, weight change, abdominal pain, difficulty swallowing, sore throat, tooth/dental problems, headaches, nasal  congestion/difficulty breathing through nose, and sneezing.    Vital Signs:  Patient profile:   40 year old female Height:      65 inches Weight:      174 pounds BMI:     29.06 O2 Sat:      100 % on Room air Pulse rate:   86 / minute BP sitting:   130 / 80  (right arm) Cuff size:   regular  Vitals Entered By: Reynaldo Minium CMA (April 15, 2010 11:38 AM)  O2 Flow:  Room air CC: Follow up visit after giving birth and had trach placed.   Physical Exam  Additional Exam:  General: A/Ox3; pleasant and cooperative, NAD, Pregnant SKIN: no rash, lesions NODES: palpable nodes right> left axilla HEENT: [place]/AT, EOM- WNL, Conjuctivae- clear, PERRLA, TM-WNL, Nose- clear, Throat- clear and wnl, [place] II-III, normal voice or minimally hoarse. Tracheostomy # 6 ID uncuffed w/ PMV NECK: Supple w/ fair ROM, JVD- none, normal carotid impulses w/o bruits Thyroid- not enlarged,  CHEST: Clear to P&A, no wheeze or cough, not labored at rest HEART: RRR, no m/g/r heard ABDOMEN: NFA:OZHY, nl pulses, no edema  NEURO: Grossly intact to observation      Impression & Recommendations:  Problem # 1:  ASTHMA (ICD-493.90) We are refilling neb med to have available. She is not having significant bronchospasm at this time.  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 We can't tell that there has been progression. Dr Ezzard Standing will discuss options with her.  Medications Added to Medication List This Visit: 1)  Synthroid 150 Mcg Tabs (Levothyroxine sodium) .... Take 1 by mouth once daily  Other Orders: Est. Patient Level III (86578)  Patient Instructions: 1)  Please schedule a follow-up appointment in 4 months. Call sooner as needed. 2)  Dr Ezzard Standing will manage your tracheostomy.  3)  Refill script for nebulizer med.  4)  cc Dr Isaias Cowman, Dr Ezzard Standing Prescriptions: PROAIR HFA 108 (90 BASE) MCG/ACT AERS (ALBUTEROL SULFATE) 2 puffs four times a day as needed rescue  #1 x  prn   Entered and Authorized by:   Waymon Budge MD   Signed by:   Waymon Budge MD on 04/15/2010   Method used:   Print then Give to Patient   RxID:   [id number] ALBUTEROL SULFATE (2.5 MG/3ML) 0.083% NEBU (ALBUTEROL SULFATE) 1 vial four times a day as needed  #25 x prn   Entered and Authorized by:   Waymon Budge MD   Signed by:   Waymon Budge MD on 04/15/2010   Method used:   Print then Give to Patient   RxID:   [id number]

## 2010-06-21 NOTE — Letter (Signed)
Summary: Narda Bonds MD   Narda Bonds MD   Imported By: Sherian Rein 02/10/2010 11:34:46  _____________________________________________________________________  External Attachment:    Type:   Image     Comment:   External Document

## 2010-06-21 NOTE — Progress Notes (Signed)
Summary: alvesco too expensive-switch to qvar  Phone Note Call from Patient Call back at Home Phone [phone]   Caller: Patient Call For: young Summary of Call: pt requests a sub for ALVESCO inhaler as this is too expensive. [facility] [address] Initial call taken by: Tivis Ringer, CNA,  February 11, 2010 4:21 PM  Follow-up for Phone Call        called spoke with patient, verified with her that the alvesco is too expensive; alternative is requested.  per medicaid formulary, qvar is the only preferred corticosteroid.  CDY, may pt be changed to this med? Follow-up by: Boone Master CNA/MA,  February 11, 2010 4:42 PM  Additional Follow-up for Phone Call Additional follow up Details #1::        I have changed med list to Qvar - ok to send Additional Follow-up by: Waymon Budge MD,  February 11, 2010 5:00 PM    Additional Follow-up for Phone Call Additional follow up Details #2::    [facility] [address].  Spoke with pt and informed this was done. Follow-up by: Vernie Murders,  February 11, 2010 5:14 PM  New/Updated Medications: QVAR 80 MCG/ACT AERS (BECLOMETHASONE DIPROPIONATE) 2 puffs and rinse mouth, once daily Prescriptions: QVAR 80 MCG/ACT AERS (BECLOMETHASONE DIPROPIONATE) 2 puffs and rinse mouth, once daily  #1 x 11   Entered by:   Vernie Murders   Authorized by:   Waymon Budge MD   Signed by:   Vernie Murders on 02/11/2010   Method used:   [facility]ron[facility] to        [facility] [address] [phone]* (retail)       [address]       [place], [place]  [address]       Ph: [phone] or [phone]       Fax: [phone]   RxID:   [id number] QVZD 63 MCG/ACT AERS (BECLOMETHASONE DIPROPIONATE) 2 puffs and rinse mouth, once daily  #1 x prn   Entered by:   Waymon Budge MD   Authorized by:   Pulmonary Triage   Signed by:   Waymon Budge MD on 02/11/2010   Method used:   Historical   RxID:   [id number]

## 2010-06-21 NOTE — Procedures (Signed)
Summary: Oximetry/[facility]  Oximetry/[facility]   Imported By: Sherian Rein 04/04/2010 09:32:30  _____________________________________________________________________  External Attachment:    Type:   Image     Comment:   External Document

## 2010-06-21 NOTE — Progress Notes (Signed)
Summary: sore throat   LMTCBX3  Phone Note Call from Patient Call back at Home Phone [phone]   Caller: Patient Call For: young Summary of Call: [facility] [address] Initial call taken by: Rickard Patience,  February 28, 2010 1:34 PM  Follow-up for Phone Call        [facility].  Arman Filter LPN  February 28, 2010 3:07 PM   [facility] Gweneth Dimitri RN  March 01, 2010 5:14 PM  LMOMTCBx3.  This is our 3rd attempt to contact pt.  Per protocol, will sign off on this message and wait for pt to call back.  Arman Filter LPN  March 03, 2010 12:39 PM

## 2010-06-21 NOTE — Progress Notes (Signed)
Summary: ACE results  Phone Note Call from Patient Call back at Home Phone [phone]   Caller: Patient Call For: young Reason for Call: Talk to Nurse Summary of Call: pt returned call for results Initial call taken by: Eugene Gavia,  January 27, 2010 9:17 AM  Follow-up for Phone Call        called spoke with patient, advised of ACE results as states by CDY in 9.6.11 append to labs.  pt verbalized her understanding. Follow-up by: Boone Master CNA/MA,  January 27, 2010 9:34 AM

## 2010-06-24 NOTE — Consult Note (Signed)
Summary: Narda Bonds MD- ENT  Narda Bonds MD- ENT   Imported By: Sherian Rein 03/07/2010 14:50:25  _____________________________________________________________________  External Attachment:    Type:   Image     Comment:   External Document

## 2010-08-01 LAB — CBC
Hemoglobin: 12.4 g/dL (ref 12.0–15.0)
MCH: 27.1 pg (ref 26.0–34.0)
WBC: 6 10*3/uL (ref 4.0–10.5)

## 2010-08-01 LAB — SURGICAL PCR SCREEN: MRSA, PCR: POSITIVE — AB

## 2010-08-02 LAB — TSH: TSH: 1.678 u[IU]/mL (ref 0.350–4.500)

## 2010-08-02 LAB — MRSA CULTURE

## 2010-08-02 LAB — COMPREHENSIVE METABOLIC PANEL
AST: 24 U/L (ref 0–37)
Alkaline Phosphatase: 84 U/L (ref 39–117)
CO2: 22 mEq/L (ref 19–32)
Creatinine, Ser: 0.52 mg/dL (ref 0.4–1.2)
GFR calc non Af Amer: >60 mL/min (ref >60)
Glucose, Bld: 108 mg/dL — ABNORMAL HIGH (ref 70–99)
Total Bilirubin: 0.2 mg/dL — ABNORMAL LOW (ref 0.3–1.2)

## 2010-08-02 LAB — FUNGUS CULTURE W SMEAR: Fungal Smear: NONE SEEN

## 2010-08-02 LAB — STREP B DNA PROBE: Strep Group B Ag: NEGATIVE

## 2010-08-02 LAB — LACTIC ACID, PLASMA
Lactic Acid, Venous: 1.2 mmol/L (ref 0.5–2.2)
Lactic Acid, Venous: 2.3 mmol/L — ABNORMAL HIGH (ref 0.5–2.2)

## 2010-08-02 LAB — CBC
Hemoglobin: 11.5 g/dL — ABNORMAL LOW (ref 12.0–15.0)
MCV: 85 fL (ref 78.0–100.0)
RBC: 4.06 MIL/uL (ref 3.87–5.11)
RDW: 14.9 % (ref 11.5–15.5)

## 2010-08-02 LAB — TISSUE CULTURE

## 2010-08-02 LAB — AFB CULTURE WITH SMEAR (NOT AT ARMC): Acid Fast Smear: NONE SEEN

## 2010-08-02 LAB — T4, FREE: Free T4: 0.8 ng/dL (ref 0.80–1.80)

## 2010-08-15 ENCOUNTER — Ambulatory Visit: Payer: Self-pay | Admitting: Internal Medicine

## 2010-10-07 NOTE — Group Therapy Note (Signed)
   Laurie Dyer, Laurie Dyer                              ACCOUNT NO.:  [id number]   MEDICAL RECORD NO.:  [id number]                   PATIENT TYPE:  OUT   LOCATION:  [facility]                           FACILITY:  [facility]   PHYSICIAN:  Argentina Donovan, MD                     DATE OF BIRTH:  [birth year]   DATE OF SERVICE:  12/25/2002                                    CLINIC NOTE   SUMMARY:  The patient was referred from high risk OB clinic for an atypical  Pap smear which showed LSIL, low-grade squamous intraepithelial lesion,  encompassing mild dysplasia, and HPV with an endocervical component present.  She is 20 weeks approximately gestation.  On examination she has a slightly  fish-mouth cervix with a large ectropion which was treated with 3% acetic  acid and showed marked acetowhite areas on most of the incision zone which  was seen 360 degrees.  There were no obvious atypical ________ punctations  or mosaicisms.  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  No biopsies were  taken at this point.   IMPRESSION:  1. Atypical Pap smear.  2. Satisfactory colposcopy.  3. 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                                               Argentina Donovan, MD    PR/MEDQ  D:  12/25/2002  T:  12/26/2002  Job:  [id number]

## 2011-07-22 ENCOUNTER — Encounter (HOSPITAL_COMMUNITY): Payer: Self-pay

## 2011-07-22 ENCOUNTER — Emergency Department (HOSPITAL_COMMUNITY)
Admission: EM | Admit: 2011-07-22 | Discharge: 2011-07-22 | Disposition: A | Discharge status: Home Health | Payer: 59 | Attending: Emergency Medicine | Admitting: Emergency Medicine

## 2011-07-22 DIAGNOSIS — R0989 Other specified symptoms and signs involving the circulatory and respiratory systems: Secondary | ICD-10-CM | POA: Insufficient documentation

## 2011-07-22 DIAGNOSIS — R061 Stridor: Secondary | ICD-10-CM | POA: Insufficient documentation

## 2011-07-22 DIAGNOSIS — M069 Rheumatoid arthritis, unspecified: Secondary | ICD-10-CM | POA: Insufficient documentation

## 2011-07-22 DIAGNOSIS — Z79899 Other long term (current) drug therapy: Secondary | ICD-10-CM | POA: Insufficient documentation

## 2011-07-22 DIAGNOSIS — R0600 Dyspnea, unspecified: Secondary | ICD-10-CM

## 2011-07-22 DIAGNOSIS — J45909 Unspecified asthma, uncomplicated: Secondary | ICD-10-CM | POA: Insufficient documentation

## 2011-07-22 DIAGNOSIS — R0609 Other forms of dyspnea: Secondary | ICD-10-CM | POA: Insufficient documentation

## 2011-07-22 MED ORDER — DEXAMETHASONE SODIUM PHOSPHATE 10 MG/ML IJ SOLN
10.0000 mg | Freq: Once | INTRAMUSCULAR | Status: AC
  Administered 2011-07-22: 10 mg via INTRAMUSCULAR
  Filled 2011-07-22: qty 1

## 2011-07-22 MED ORDER — ALBUTEROL SULFATE (5 MG/ML) 0.5% IN NEBU
2.5000 mg | INHALATION_SOLUTION | RESPIRATORY_TRACT | Status: AC
  Administered 2011-07-22: 2.5 mg via RESPIRATORY_TRACT
  Filled 2011-07-22 (×2): qty 0.5

## 2011-07-22 NOTE — ED Notes (Signed)
Pt having continuous coughing x 2 min and after coughing, pt reports that she had coughed the stuff up that was dislodged in her throat

## 2011-07-22 NOTE — ED Notes (Signed)
RT called for resp treatment.

## 2011-07-22 NOTE — Discharge Instructions (Signed)
Please make sure to return to emergency department immediately if you develop any concerning changes in your condition, such as new difficulty breathing, new difficulty swallowing, new fever, no chills, or anything at all.  Please make sure to follow up with your ENT specialist at [facility] as soon as possible.

## 2011-07-22 NOTE — ED Provider Notes (Signed)
History     CSN: [id number]  Arrival date & time 07/22/11  0902   First MD Initiated Contact with Patient 07/22/11 [phone]      No chief complaint on file.    HPI The patient presents with dyspnea, since of throat tightness.  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  Yesterday she had her second tracheal dilation, done at [facility].  She notes that immediately following the procedure she was generally well.  Today, approximately 6 hours ago, she woke with the sense of tightness in her upper throat.  Since that time this sensation has been persistent.  She denies significant relief with albuterol.  She denies any pain, cough, fevers, nausea.  No clear exacerbating factor.  Past Medical History  Diagnosis Date  . Asthma   . Rhinitis   . Rheumatoid arthritis   . Dyspnea     Past Surgical History  Procedure Date  . Rhinoscopy   . Nasal mucosal     No family history on file.  History  Substance Use Topics  . Smoking status: Never Smoker   . Smokeless tobacco: Not on file  . Alcohol Use: No    OB History    Grav Para Term Preterm Abortions TAB SAB Ect Mult Living                  Review of Systems  All other systems reviewed and are negative.    Allergies  Sulfonamide derivatives  Home Medications   Current Outpatient Rx  Name Route Sig Dispense Refill  . ALBUTEROL SULFATE HFA 108 (90 BASE) MCG/ACT IN AERS Inhalation Inhale 2 puffs into the lungs every 6 (six) hours as needed.      . ALBUTEROL SULFATE (2.5 MG/3ML) 0.083% IN NEBU Nebulization Take 2.5 mg by nebulization every 6 (six) hours as needed.      . BECLOMETHASONE DIPROPIONATE 80 MCG/ACT IN AERS Inhalation Inhale 2 puffs into the lungs. 2 puffs once daily, rinse mouth     . LEVOTHYROXINE SODIUM 100 MCG PO TABS Oral Take 100 mcg by mouth daily.      Marland Kitchen LEVOTHYROXINE SODIUM 150 MCG PO TABS Oral Take 150 mcg by mouth daily.        . SODIUM CHLORIDE-SODIUM BICARB 1.57 G NA PACK Nasal by Nasal route. Use as directed       BP 130/74  Pulse 82  Temp(Src) 98.2 F (36.8 C) (Oral)  Resp 18  SpO2 100%  Physical Exam  Constitutional: She is oriented to person, place, and time. She appears well-developed and well-nourished. No distress.  HENT:  Head: Normocephalic and atraumatic.  Mouth/Throat: Uvula is midline and mucous membranes are normal. No uvula swelling. No posterior oropharyngeal edema, posterior oropharyngeal erythema or tonsillar abscesses.  Eyes: Conjunctivae and EOM are normal.  Neck: Neck supple. No tracheal tenderness present. No tracheal deviation present. No mass present.  Cardiovascular: Normal rate, regular rhythm and normal heart sounds.   Pulmonary/Chest: Effort normal. Stridor present. No respiratory distress.  Neurological: She is alert and oriented to person, place, and time. No cranial nerve deficit.  Skin: Skin is warm and dry.  Psychiatric: She has a normal mood and affect.    ED Course  Procedures (including critical care time)  Labs Reviewed - No data to display No results found.   No diagnosis found.   Pulse oximetry 100% on room air normal MDM  This patient presents one day  after tracheal dilation with dyspnea, sense of foreign body.  Following a nebulizer treatment, steroids, the patient appears productive cough, following which, she felt significantly better, back to baseline.  The patient was discharged in stable condition to follow up with her specialist via telephone today, and in person as scheduled.  Return precautions were provided       Gerhard Munch, MD 07/22/11 1019

## 2011-07-22 NOTE — ED Notes (Signed)
Per ems  Pt from home, had esophagus dilation yesterday and now reports tightening of her esophagus

## 2011-07-27 ENCOUNTER — Encounter: Payer: Self-pay | Admitting: Obstetrics and Gynecology

## 2011-08-07 ENCOUNTER — Ambulatory Visit (HOSPITAL_COMMUNITY)
Admission: RE | Admit: 2011-08-07 | Discharge: 2011-08-07 | Disposition: A | Discharge status: Home / Self Care | Payer: 59 | Source: Ambulatory Visit | Attending: Rheumatology | Admitting: Rheumatology

## 2011-08-07 ENCOUNTER — Other Ambulatory Visit (HOSPITAL_COMMUNITY): Payer: Self-pay | Admitting: Rheumatology

## 2011-08-07 DIAGNOSIS — R52 Pain, unspecified: Secondary | ICD-10-CM | POA: Insufficient documentation

## 2011-09-03 IMAGING — CR DG CHEST 2V
2 series · 2 of 2 positions shown · non-contrast
Comparison: Chest radiograph 11/27/2007

CLINICAL DATA: Pregnancy and cough.

CHEST - 2 VIEW

[view not recorded (1 of 2)]
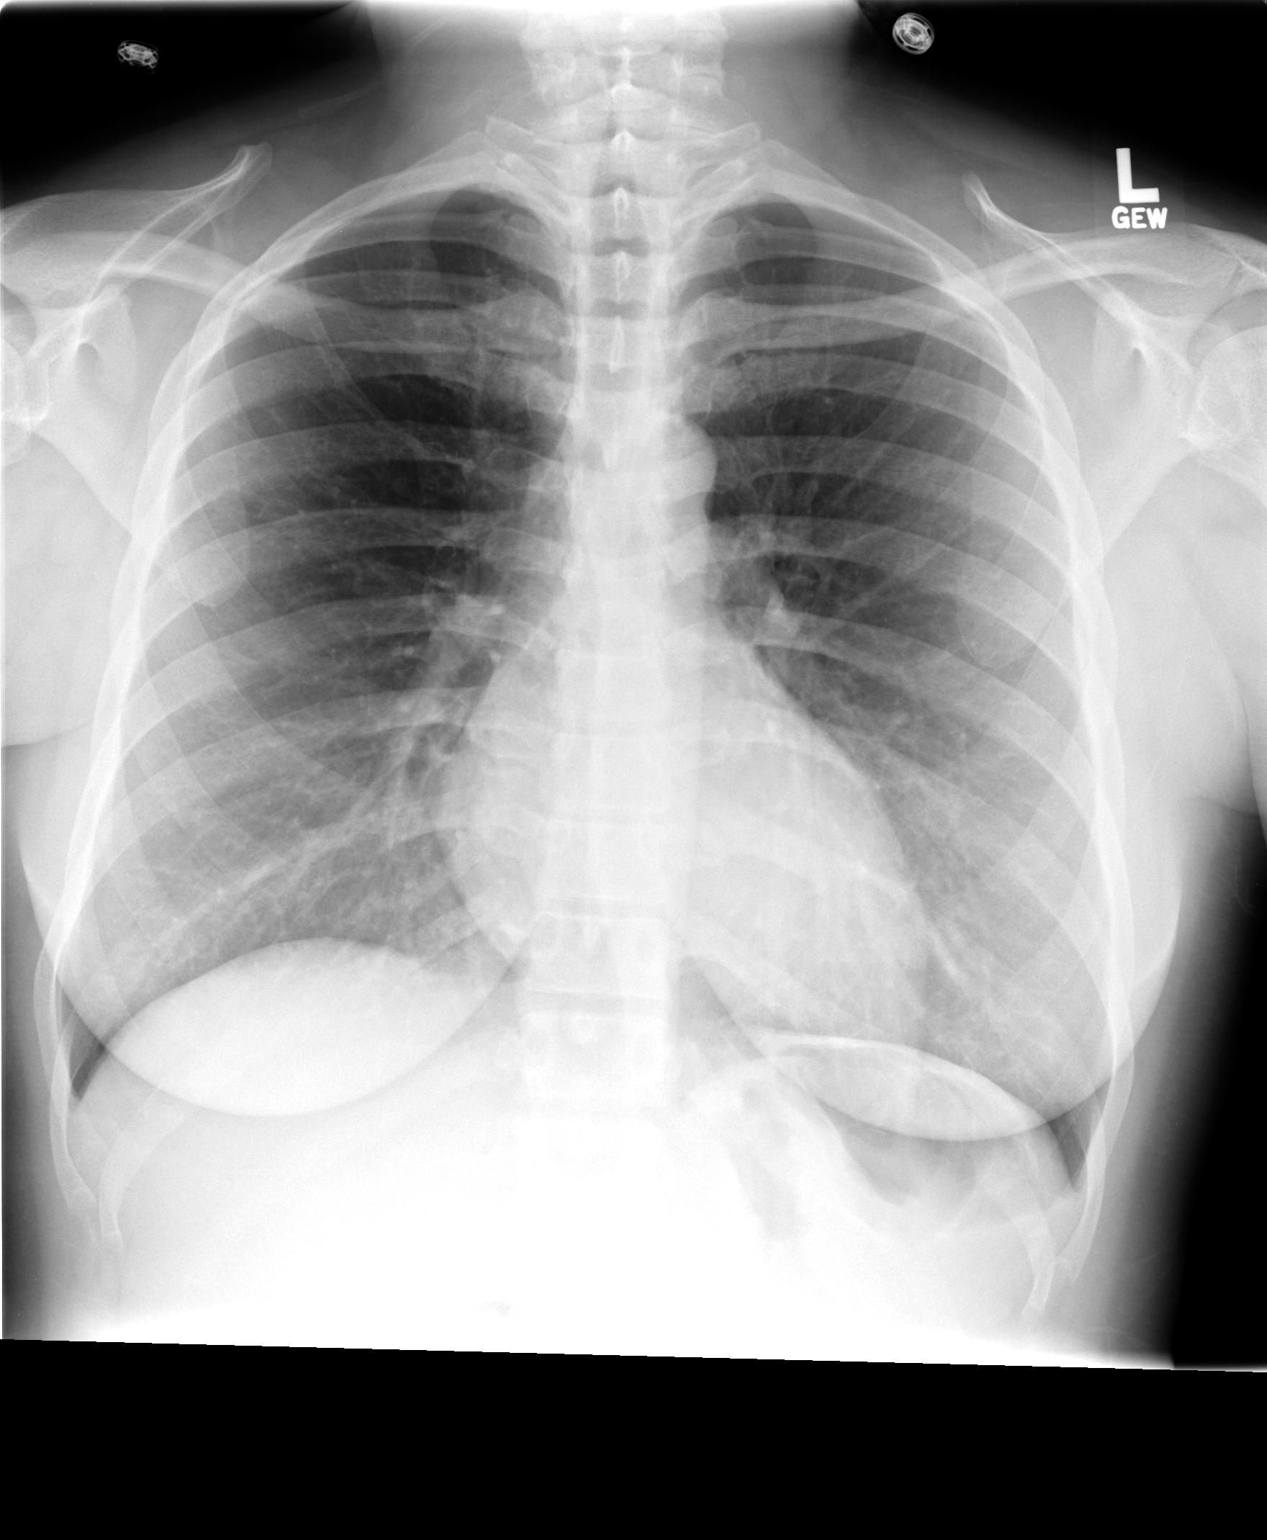

[view not recorded (2 of 2)]
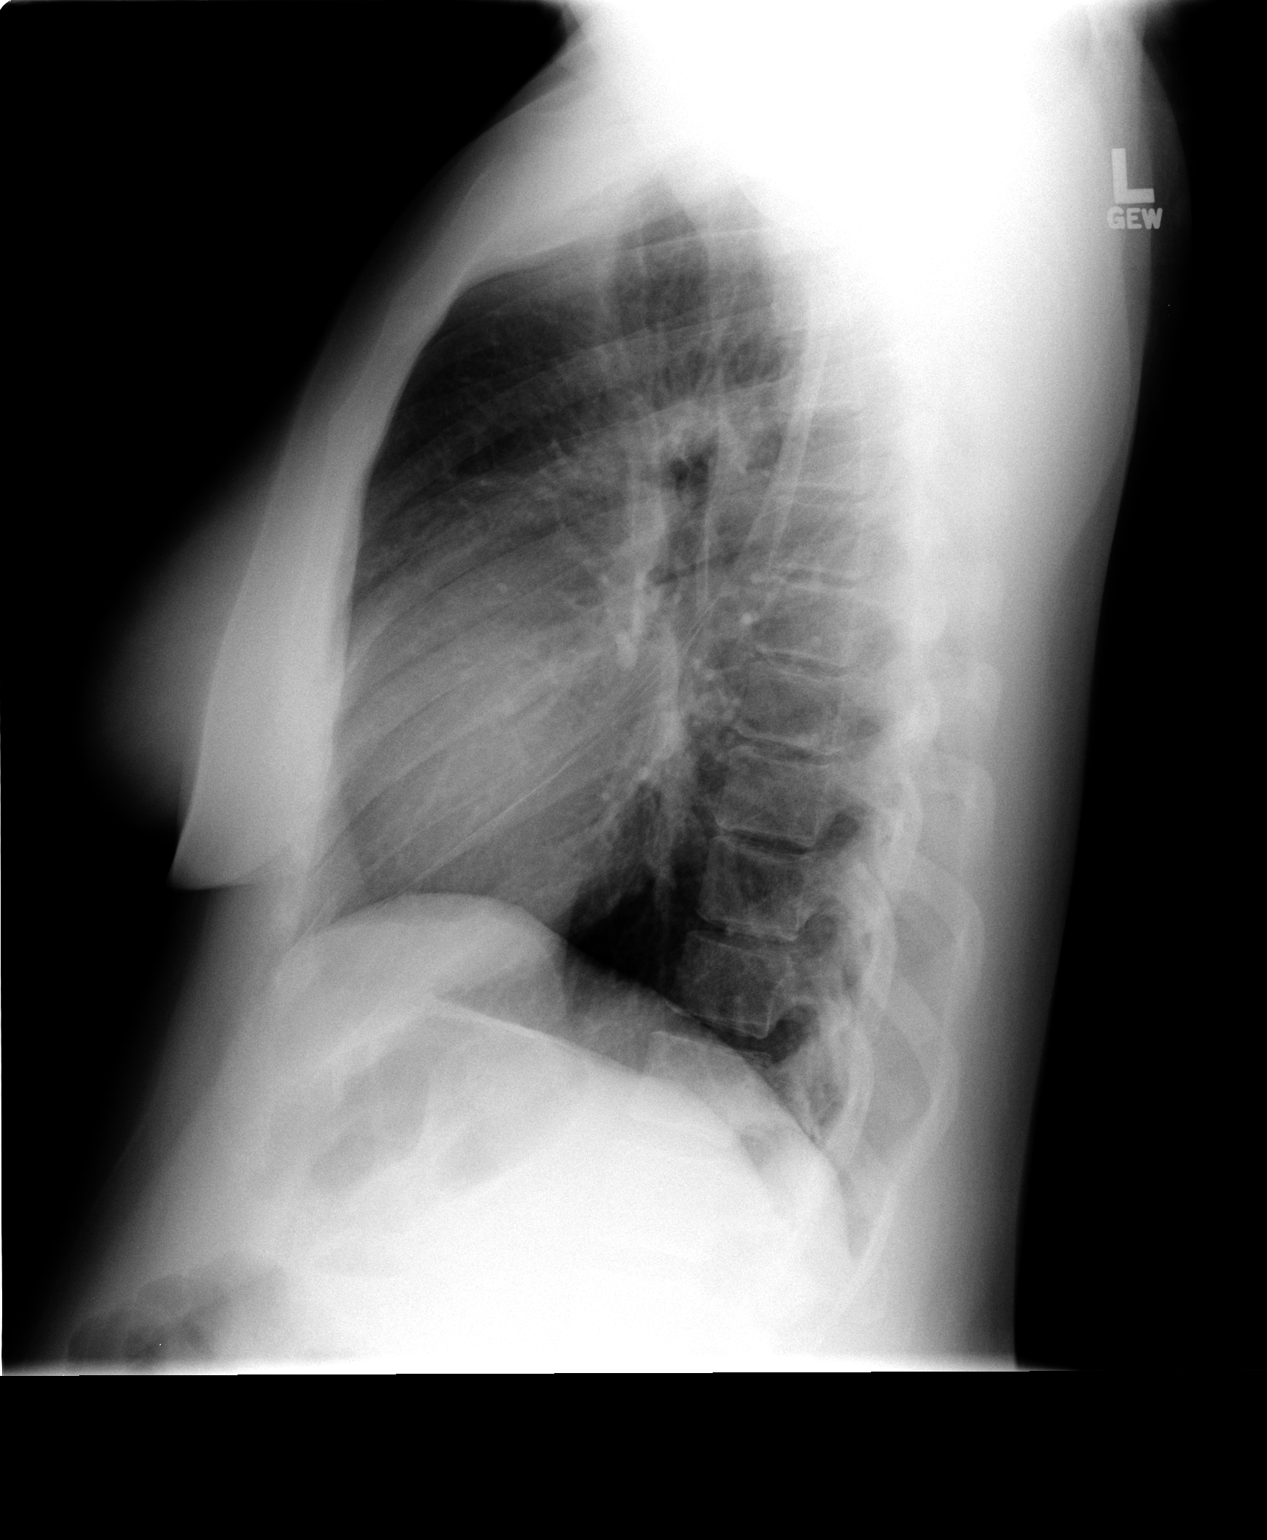

[2 of 2 positions shown; findings below may reference images not displayed]

FINDINGS: Heart and mediastinal contours are within normal limits
and stable.  Trachea is midline.  No evidence of lymphadenopathy.
Pulmonary vascularity is normal.  Lungs are clear.  No acute or
suspicious bony abnormality.
IMPRESSION: Normal chest radiograph.

## 2012-03-19 ENCOUNTER — Encounter: Payer: Self-pay | Admitting: Obstetrics and Gynecology

## 2012-03-26 ENCOUNTER — Ambulatory Visit (INDEPENDENT_AMBULATORY_CARE_PROVIDER_SITE_OTHER): Payer: 59 | Admitting: Obstetrics and Gynecology

## 2012-03-26 ENCOUNTER — Encounter: Payer: Self-pay | Admitting: Obstetrics and Gynecology

## 2012-03-26 VITALS — BP 130/70 | Ht 65.0 in | Wt 189.0 lb

## 2012-03-26 DIAGNOSIS — N949 Unspecified condition associated with female genital organs and menstrual cycle: Secondary | ICD-10-CM

## 2012-03-26 DIAGNOSIS — Z139 Encounter for screening, unspecified: Secondary | ICD-10-CM

## 2012-03-26 DIAGNOSIS — R102 Pelvic and perineal pain: Secondary | ICD-10-CM

## 2012-03-26 DIAGNOSIS — E039 Hypothyroidism, unspecified: Secondary | ICD-10-CM

## 2012-03-26 DIAGNOSIS — Z975 Presence of (intrauterine) contraceptive device: Secondary | ICD-10-CM

## 2012-03-26 DIAGNOSIS — N898 Other specified noninflammatory disorders of vagina: Secondary | ICD-10-CM

## 2012-03-26 LAB — POCT URINALYSIS DIPSTICK
Bilirubin, UA: NEGATIVE
Leukocytes, UA: NEGATIVE
Nitrite, UA: NEGATIVE
Protein, UA: NEGATIVE
pH, UA: 5

## 2012-03-26 LAB — CBC
MCHC: 33.1 g/dL (ref 30.0–36.0)
Platelets: 354 10*3/uL (ref 150–400)
RDW: 14.3 % (ref 11.5–15.5)

## 2012-03-26 LAB — POCT WET PREP (WET MOUNT)
Bacteria Wet Prep HPF POC: NEGATIVE
pH: 5

## 2012-03-26 MED ORDER — CLOTRIMAZOLE-BETAMETHASONE 1-0.05 % EX CREA: TOPICAL_CREAM | Freq: Every day | CUTANEOUS | Status: DC

## 2012-03-26 NOTE — Progress Notes (Addendum)
Multiple complaints.  C/o recurrent yeast infxn but only officially dx'd once. Also c/o discomfort in RLQ and reports h/o ovarian cyst rupturing.  Filed Vitals:   03/26/12 0920  BP: 130/70   ROS: noncontributory  Pelvic exam:  VULVA: normal appearing vulva with no masses, tenderness or lesions,  VAGINA: normal appearing vagina with normal color and discharge, no lesions, CERVIX: normal appearing cervix without discharge or lesions, iud string visible UTERUS: uterus is normal size, shape, consistency and tenderness with uterine palpation,  ADNEXA: normal adnexa in size, nontender and no masses.  Results for orders placed in visit on 03/26/12  POCT URINALYSIS DIPSTICK      Component Value Range   Color, UA       Clarity, UA       Glucose, UA neg     Bilirubin, UA neg     Ketones, UA neg     Spec Grav, UA 1.015     Blood, UA neg     pH, UA 5.0     Protein, UA neg     Urobilinogen, UA negative     Nitrite, UA neg     Leukocytes, UA Negative      A/P Wet prep - inc ph - trial of rephresh U/s secondary to rlq pain Trial of lotrisone externally Check cbc and vit d H/o hypothyroid on synthroid tsh check within last mth - actively being managed Recurrent vaginitis may persist until thyroid fxn is normalized

## 2012-03-26 NOTE — Addendum Note (Signed)
Addended by: Marla Roe A on: 03/26/2012 03:14 PM   Modules accepted: Orders

## 2012-03-27 LAB — GC/CHLAMYDIA PROBE AMP: CT Probe RNA: NEGATIVE

## 2012-03-27 LAB — VITAMIN D 25 HYDROXY (VIT D DEFICIENCY, FRACTURES): Vit D, 25-Hydroxy: 21 ng/mL — ABNORMAL LOW (ref 30–89)

## 2012-04-29 ENCOUNTER — Telehealth: Payer: Self-pay | Admitting: Obstetrics and Gynecology

## 2012-04-29 NOTE — Telephone Encounter (Signed)
See pt msg

## 2012-05-02 ENCOUNTER — Other Ambulatory Visit: Payer: Self-pay

## 2012-05-02 DIAGNOSIS — E559 Vitamin D deficiency, unspecified: Secondary | ICD-10-CM

## 2012-05-02 NOTE — Telephone Encounter (Signed)
Spoke to pt to notify her of need for Vit D protocol. Laurie Dyer A

## 2012-05-02 NOTE — Telephone Encounter (Signed)
LM for pt to cb re Vit D level. Melody Comas A

## 2012-05-27 ENCOUNTER — Other Ambulatory Visit: Payer: 59

## 2012-05-27 ENCOUNTER — Encounter: Payer: 59 | Admitting: Obstetrics and Gynecology

## 2012-11-25 ENCOUNTER — Other Ambulatory Visit: Payer: Self-pay | Admitting: Obstetrics and Gynecology

## 2012-11-25 DIAGNOSIS — Z1231 Encounter for screening mammogram for malignant neoplasm of breast: Secondary | ICD-10-CM

## 2013-03-25 ENCOUNTER — Ambulatory Visit: Payer: 59

## 2013-03-26 ENCOUNTER — Ambulatory Visit: Payer: 59

## 2013-04-16 ENCOUNTER — Ambulatory Visit
Admission: RE | Admit: 2013-04-16 | Discharge: 2013-04-16 | Disposition: A | Discharge status: Home / Self Care | Payer: 59 | Source: Ambulatory Visit | Attending: Obstetrics and Gynecology | Admitting: Obstetrics and Gynecology

## 2013-04-16 DIAGNOSIS — Z1231 Encounter for screening mammogram for malignant neoplasm of breast: Secondary | ICD-10-CM

## 2014-03-23 ENCOUNTER — Encounter: Payer: Self-pay | Admitting: Obstetrics and Gynecology

## 2014-05-26 ENCOUNTER — Other Ambulatory Visit: Payer: Self-pay

## 2014-05-26 DIAGNOSIS — Z1231 Encounter for screening mammogram for malignant neoplasm of breast: Secondary | ICD-10-CM

## 2014-06-08 ENCOUNTER — Ambulatory Visit
Admission: RE | Admit: 2014-06-08 | Discharge: 2014-06-08 | Disposition: A | Discharge status: Home / Self Care | Payer: 59 | Source: Ambulatory Visit

## 2014-06-08 DIAGNOSIS — Z1231 Encounter for screening mammogram for malignant neoplasm of breast: Secondary | ICD-10-CM

## 2014-07-27 ENCOUNTER — Emergency Department (HOSPITAL_COMMUNITY)
Admission: EM | Admit: 2014-07-27 | Discharge: 2014-07-27 | Disposition: A | Discharge status: Home / Self Care | Payer: 59 | Attending: Emergency Medicine | Admitting: Emergency Medicine

## 2014-07-27 ENCOUNTER — Encounter (HOSPITAL_COMMUNITY): Payer: Self-pay | Admitting: *Deleted

## 2014-07-27 DIAGNOSIS — J45909 Unspecified asthma, uncomplicated: Secondary | ICD-10-CM | POA: Diagnosis not present

## 2014-07-27 DIAGNOSIS — Z8739 Personal history of other diseases of the musculoskeletal system and connective tissue: Secondary | ICD-10-CM | POA: Diagnosis not present

## 2014-07-27 DIAGNOSIS — Z7952 Long term (current) use of systemic steroids: Secondary | ICD-10-CM | POA: Insufficient documentation

## 2014-07-27 DIAGNOSIS — Y9389 Activity, other specified: Secondary | ICD-10-CM | POA: Insufficient documentation

## 2014-07-27 DIAGNOSIS — S3992XA Unspecified injury of lower back, initial encounter: Secondary | ICD-10-CM | POA: Diagnosis not present

## 2014-07-27 DIAGNOSIS — M545 Low back pain, unspecified: Secondary | ICD-10-CM

## 2014-07-27 DIAGNOSIS — S5001XA Contusion of right elbow, initial encounter: Secondary | ICD-10-CM | POA: Diagnosis not present

## 2014-07-27 DIAGNOSIS — Y92410 Unspecified street and highway as the place of occurrence of the external cause: Secondary | ICD-10-CM | POA: Insufficient documentation

## 2014-07-27 DIAGNOSIS — S8001XA Contusion of right knee, initial encounter: Secondary | ICD-10-CM

## 2014-07-27 DIAGNOSIS — Z79899 Other long term (current) drug therapy: Secondary | ICD-10-CM | POA: Diagnosis not present

## 2014-07-27 DIAGNOSIS — S8991XA Unspecified injury of right lower leg, initial encounter: Secondary | ICD-10-CM | POA: Diagnosis present

## 2014-07-27 DIAGNOSIS — Y998 Other external cause status: Secondary | ICD-10-CM | POA: Insufficient documentation

## 2014-07-27 DIAGNOSIS — E079 Disorder of thyroid, unspecified: Secondary | ICD-10-CM | POA: Insufficient documentation

## 2014-07-27 HISTORY — DX: Disorder of thyroid, unspecified: E07.9

## 2014-07-27 MED ORDER — HYDROCODONE-ACETAMINOPHEN 5-325 MG PO TABS: 2.0000 | ORAL_TABLET | ORAL | Status: DC | PRN

## 2014-07-27 MED ORDER — METHOCARBAMOL 500 MG PO TABS
500.0000 mg | ORAL_TABLET | Freq: Two times a day (BID) | ORAL | Status: DC
Start: 2014-07-27 — End: 2020-08-18

## 2014-07-27 MED ORDER — LIDOCAINE HCL 2 % IJ SOLN: 5.0000 mL | Freq: Once | INTRAMUSCULAR | Status: DC

## 2014-07-27 MED ORDER — LIDOCAINE HCL 2 % IJ SOLN
10.0000 mL | Freq: Once | INTRAMUSCULAR | Status: DC
  Filled 2014-07-27: qty 20
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
## 2014-07-27 NOTE — Discharge Instructions (Signed)
Expect to have increased pain over the next two days with lessening of symptoms thereafter. Please use Ibuprofen 600 mg three times per day as needed for pain. Hydrocodone for breakthrough pain, and muscle relaxer as needed for muscle spasms. If symptoms are not managed by current therapy or you experience a significant increase in pain over expected please seek further evaluation. If bowel or bladder functioning changes please seek immediate medical attention. Ice and elevation can be used in addition to above treatment plan.
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
## 2015-07-12 ENCOUNTER — Emergency Department (HOSPITAL_COMMUNITY)
Admission: EM | Admit: 2015-07-12 | Discharge: 2015-07-12 | Discharge status: Absent without leave | Payer: Self-pay | Source: Home / Self Care

## 2015-07-13 ENCOUNTER — Emergency Department (HOSPITAL_COMMUNITY)
Admission: EM | Admit: 2015-07-13 | Discharge: 2015-07-13 | Disposition: A | Discharge status: Home / Self Care | Payer: BLUE CROSS/BLUE SHIELD | Attending: Emergency Medicine | Admitting: Emergency Medicine

## 2015-07-13 ENCOUNTER — Encounter (HOSPITAL_COMMUNITY): Payer: Self-pay | Admitting: *Deleted

## 2015-07-13 ENCOUNTER — Emergency Department (HOSPITAL_COMMUNITY): Payer: BLUE CROSS/BLUE SHIELD

## 2015-07-13 DIAGNOSIS — M069 Rheumatoid arthritis, unspecified: Secondary | ICD-10-CM | POA: Diagnosis not present

## 2015-07-13 DIAGNOSIS — E079 Disorder of thyroid, unspecified: Secondary | ICD-10-CM | POA: Insufficient documentation

## 2015-07-13 DIAGNOSIS — R0602 Shortness of breath: Secondary | ICD-10-CM | POA: Diagnosis not present

## 2015-07-13 DIAGNOSIS — Z8669 Personal history of other diseases of the nervous system and sense organs: Secondary | ICD-10-CM | POA: Insufficient documentation

## 2015-07-13 DIAGNOSIS — R079 Chest pain, unspecified: Secondary | ICD-10-CM | POA: Diagnosis present

## 2015-07-13 DIAGNOSIS — Z79899 Other long term (current) drug therapy: Secondary | ICD-10-CM | POA: Diagnosis not present

## 2015-07-13 DIAGNOSIS — J45901 Unspecified asthma with (acute) exacerbation: Secondary | ICD-10-CM | POA: Diagnosis not present

## 2015-07-13 HISTORY — DX: Other specified diseases of upper respiratory tract: J39.8

## 2015-07-13 HISTORY — DX: Vitreoretinal dystrophy: H35.51

## 2015-07-13 NOTE — Discharge Instructions (Signed)
Follow-up with your doctors at [facility]. Return here for any severe trouble breathing

## 2015-07-13 NOTE — ED Provider Notes (Signed)
CSN: [id number]     Arrival date & time 07/13/15  1914 History   First MD Initiated Contact with Patient 07/13/15 [phone]     Chief Complaint  Patient presents with  . 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 Also complains of not being able to cough up her secretions. Had a tracheal dilation several days ago and that symptoms started. Denies any fever or chills. Slight cough and congestion. No dyspnea. Called her doctor and told to come here. No treatment use prior to arrival  The history is provided by the patient.    Past Medical History  Diagnosis Date  . Asthma   . Rhinitis   . Rheumatoid arthritis(714.0)   . Dyspnea   . Thyroid disease   . Wagner syndrome   . Tracheal stenosis    Past Surgical History  Procedure Laterality Date  . Rhinoscopy    . Nasal mucosal    . Tracheal dilitation    . Tracheostomy    . Tracheostomy closure     Family History  Problem Relation Age of Onset  . Diabetes Father    Social History  Substance Use Topics  . Smoking status: Never Smoker   . Smokeless tobacco: None  . Alcohol Use: No   OB History    Gravida Para Term Preterm AB TAB SAB Ectopic Multiple Living   3 2   1           Review of Systems  All other systems reviewed and are negative.     Allergies  Sulfonamide derivatives  Home Medications   Prior to Admission medications   Medication Sig Start Date End Date Taking? Authorizing Provider  albuterol (PROAIR HFA) 108 (90 BASE) MCG/ACT inhaler Inhale 1 puff into the lungs every 6 (six) hours as needed. wheezing   Yes Historical Provider, MD  ibuprofen (ADVIL,MOTRIN) 200 MG tablet Take 400 mg by mouth every 6 (six) hours as needed for moderate pain.   Yes Historical Provider, MD  levothyroxine (SYNTHROID, LEVOTHROID) 125 MCG tablet  Take 125 mcg by mouth daily before breakfast.   Yes Historical Provider, MD  methotrexate (RHEUMATREX) 2.5 MG tablet Take 2.5 mg by mouth once a week. Caution:Chemotherapy. Protect from light.   Yes Historical Provider, MD  methylPREDNISolone (MEDROL) 4 MG TBPK tablet Take 1 tablet by mouth as directed. 07/06/15 07/13/15 Yes Historical Provider, MD  clotrimazole-betamethasone (LOTRISONE) cream Apply topically daily. For 5 days. Patient not taking: Reported on 07/13/2015 03/26/12   13/5/13, MD  HYDROcodone-acetaminophen (NORCO/VICODIN) 5-325 MG per tablet Take 2 tablets by mouth every 4 (four) hours as needed. Patient not taking: Reported on 07/13/2015 07/27/14   09/26/14, PA-C  methocarbamol (ROBAXIN) 500 MG tablet Take 1 tablet (500 mg total) by mouth 2 (two) times daily. Patient not taking: Reported on 07/13/2015 07/27/14   09/26/14, PA-C   BP 157/101 mmHg  Pulse 84  Temp(Src) 98.3 F (36.8 C) (Oral)  Resp 20  Ht 5\' 6"  (1.676 m)  Wt 78.472 kg  BMI 27.94 kg/m2  SpO2 99% Physical Exam  Constitutional: She is oriented to person, place, and time. She appears well-developed and well-nourished.  Non-toxic appearance. No distress.  HENT:  Head: Normocephalic and atraumatic.  Eyes: Conjunctivae, EOM and lids are normal. Pupils are equal, round, and reactive to light.  Neck: Normal range of motion. Neck supple.  No tracheal deviation present. No thyroid mass present.  Cardiovascular: Normal rate, regular rhythm and normal heart sounds.  Exam reveals no gallop.   No murmur heard. Pulmonary/Chest: Effort normal and breath sounds normal. No stridor. No respiratory distress. She has no decreased breath sounds. She has no wheezes. She has no rhonchi. She has no rales.  Abdominal: Soft. Normal appearance and bowel sounds are normal. She exhibits no distension. There is no tenderness. There is no rebound and no CVA tenderness.  Musculoskeletal: Normal range of motion. She exhibits no edema or  tenderness.  Neurological: She is alert and oriented to person, place, and time. She has normal strength. No cranial nerve deficit or sensory deficit. GCS eye subscore is 4. GCS verbal subscore is 5. GCS motor subscore is 6.  Skin: Skin is warm and dry. No abrasion and no rash noted.  Psychiatric: She has a normal mood and affect. Her speech is normal and behavior is normal.  Nursing note and vitals reviewed.   ED Course  Procedures (including critical care time) Labs Review Labs Reviewed - No data to display  Imaging Review No results found. I have personally reviewed and evaluated these images and lab results as part of my medical decision-making.   EKG Interpretation None      MDM   Final diagnoses:  SOB (shortness of breath)    X-rays negative. Patient encouraged to follow-up at [facility]    Lorre Nick, MD 07/13/15 [phone]

## 2015-07-13 NOTE — ED Notes (Signed)
PT states that she had Tracheal Dilation on Thurs; pt states that she has been having an irritated throat, neck and rt side of chest discomfort; pt states that she spoke to the surgeon and was advised humidified air; pt states that the air has not helped;pt states that she has had a mucous plug in the past from the same surgery and is concerned that it what is happening; pt states that her breathing is better now than before the surgery; pt states "It feels like some is way down in there"; pt denies difficulty swallowing

## 2015-11-30 DIAGNOSIS — M0579 Rheumatoid arthritis with rheumatoid factor of multiple sites without organ or systems involvement: Secondary | ICD-10-CM | POA: Diagnosis not present

## 2015-11-30 DIAGNOSIS — I776 Arteritis, unspecified: Secondary | ICD-10-CM | POA: Diagnosis not present

## 2015-12-03 DIAGNOSIS — Z124 Encounter for screening for malignant neoplasm of cervix: Secondary | ICD-10-CM | POA: Diagnosis not present

## 2015-12-03 DIAGNOSIS — Z139 Encounter for screening, unspecified: Secondary | ICD-10-CM | POA: Diagnosis not present

## 2015-12-03 DIAGNOSIS — Z01419 Encounter for gynecological examination (general) (routine) without abnormal findings: Secondary | ICD-10-CM | POA: Diagnosis not present

## 2015-12-03 DIAGNOSIS — Z6830 Body mass index (BMI) 30.0-30.9, adult: Secondary | ICD-10-CM | POA: Diagnosis not present

## 2015-12-03 DIAGNOSIS — Z1231 Encounter for screening mammogram for malignant neoplasm of breast: Secondary | ICD-10-CM | POA: Diagnosis not present

## 2015-12-03 DIAGNOSIS — E559 Vitamin D deficiency, unspecified: Secondary | ICD-10-CM | POA: Diagnosis not present

## 2015-12-15 ENCOUNTER — Other Ambulatory Visit: Payer: Self-pay | Admitting: Obstetrics and Gynecology

## 2015-12-15 DIAGNOSIS — R928 Other abnormal and inconclusive findings on diagnostic imaging of breast: Secondary | ICD-10-CM

## 2015-12-20 ENCOUNTER — Other Ambulatory Visit: Payer: BLUE CROSS/BLUE SHIELD

## 2015-12-27 ENCOUNTER — Ambulatory Visit
Admission: RE | Admit: 2015-12-27 | Discharge: 2015-12-27 | Disposition: A | Discharge status: Home / Self Care | Payer: BLUE CROSS/BLUE SHIELD | Source: Ambulatory Visit | Attending: Obstetrics and Gynecology | Admitting: Obstetrics and Gynecology

## 2015-12-27 DIAGNOSIS — R928 Other abnormal and inconclusive findings on diagnostic imaging of breast: Secondary | ICD-10-CM

## 2015-12-27 DIAGNOSIS — N63 Unspecified lump in breast: Secondary | ICD-10-CM | POA: Diagnosis not present

## 2015-12-27 DIAGNOSIS — N6012 Diffuse cystic mastopathy of left breast: Secondary | ICD-10-CM | POA: Diagnosis not present

## 2016-02-29 DIAGNOSIS — J329 Chronic sinusitis, unspecified: Secondary | ICD-10-CM | POA: Diagnosis not present

## 2016-02-29 DIAGNOSIS — Z113 Encounter for screening for infections with a predominantly sexual mode of transmission: Secondary | ICD-10-CM | POA: Diagnosis not present

## 2016-02-29 DIAGNOSIS — Z30433 Encounter for removal and reinsertion of intrauterine contraceptive device: Secondary | ICD-10-CM | POA: Diagnosis not present

## 2016-03-22 DIAGNOSIS — Z79899 Other long term (current) drug therapy: Secondary | ICD-10-CM | POA: Diagnosis not present

## 2016-03-22 DIAGNOSIS — Z7951 Long term (current) use of inhaled steroids: Secondary | ICD-10-CM | POA: Diagnosis not present

## 2016-03-22 DIAGNOSIS — Z803 Family history of malignant neoplasm of breast: Secondary | ICD-10-CM | POA: Diagnosis not present

## 2016-03-22 DIAGNOSIS — I776 Arteritis, unspecified: Secondary | ICD-10-CM | POA: Diagnosis not present

## 2016-03-22 DIAGNOSIS — Z23 Encounter for immunization: Secondary | ICD-10-CM | POA: Diagnosis not present

## 2016-03-22 DIAGNOSIS — J386 Stenosis of larynx: Secondary | ICD-10-CM | POA: Diagnosis not present

## 2016-03-22 DIAGNOSIS — M0579 Rheumatoid arthritis with rheumatoid factor of multiple sites without organ or systems involvement: Secondary | ICD-10-CM | POA: Diagnosis not present

## 2016-03-22 DIAGNOSIS — Z8249 Family history of ischemic heart disease and other diseases of the circulatory system: Secondary | ICD-10-CM | POA: Diagnosis not present

## 2016-03-22 DIAGNOSIS — Z833 Family history of diabetes mellitus: Secondary | ICD-10-CM | POA: Diagnosis not present

## 2016-03-29 DIAGNOSIS — J3489 Other specified disorders of nose and nasal sinuses: Secondary | ICD-10-CM | POA: Diagnosis not present

## 2016-03-29 DIAGNOSIS — Z79899 Other long term (current) drug therapy: Secondary | ICD-10-CM | POA: Diagnosis not present

## 2016-03-29 DIAGNOSIS — M3130 Wegener's granulomatosis without renal involvement: Secondary | ICD-10-CM | POA: Diagnosis not present

## 2016-03-29 DIAGNOSIS — J386 Stenosis of larynx: Secondary | ICD-10-CM | POA: Diagnosis not present

## 2016-03-29 DIAGNOSIS — R490 Dysphonia: Secondary | ICD-10-CM | POA: Diagnosis not present

## 2016-03-29 DIAGNOSIS — J384 Edema of larynx: Secondary | ICD-10-CM | POA: Diagnosis not present

## 2016-06-28 DIAGNOSIS — M3130 Wegener's granulomatosis without renal involvement: Secondary | ICD-10-CM | POA: Diagnosis not present

## 2016-06-28 DIAGNOSIS — J398 Other specified diseases of upper respiratory tract: Secondary | ICD-10-CM | POA: Diagnosis not present

## 2016-06-28 DIAGNOSIS — J386 Stenosis of larynx: Secondary | ICD-10-CM | POA: Diagnosis not present

## 2016-06-28 DIAGNOSIS — J384 Edema of larynx: Secondary | ICD-10-CM | POA: Diagnosis not present

## 2016-06-28 DIAGNOSIS — E890 Postprocedural hypothyroidism: Secondary | ICD-10-CM | POA: Diagnosis not present

## 2016-07-20 DIAGNOSIS — J398 Other specified diseases of upper respiratory tract: Secondary | ICD-10-CM | POA: Diagnosis not present

## 2016-07-20 DIAGNOSIS — J386 Stenosis of larynx: Secondary | ICD-10-CM | POA: Diagnosis not present

## 2016-07-20 DIAGNOSIS — M0579 Rheumatoid arthritis with rheumatoid factor of multiple sites without organ or systems involvement: Secondary | ICD-10-CM | POA: Diagnosis not present

## 2016-07-20 DIAGNOSIS — E039 Hypothyroidism, unspecified: Secondary | ICD-10-CM | POA: Diagnosis not present

## 2016-07-20 DIAGNOSIS — M3130 Wegener's granulomatosis without renal involvement: Secondary | ICD-10-CM | POA: Diagnosis not present

## 2016-07-20 DIAGNOSIS — Z975 Presence of (intrauterine) contraceptive device: Secondary | ICD-10-CM | POA: Diagnosis not present

## 2016-07-20 DIAGNOSIS — Z79899 Other long term (current) drug therapy: Secondary | ICD-10-CM | POA: Diagnosis not present

## 2016-07-20 DIAGNOSIS — Z882 Allergy status to sulfonamides status: Secondary | ICD-10-CM | POA: Diagnosis not present

## 2016-08-28 DIAGNOSIS — M3130 Wegener's granulomatosis without renal involvement: Secondary | ICD-10-CM | POA: Diagnosis not present

## 2016-08-28 DIAGNOSIS — J398 Other specified diseases of upper respiratory tract: Secondary | ICD-10-CM | POA: Diagnosis not present

## 2016-08-28 DIAGNOSIS — K5900 Constipation, unspecified: Secondary | ICD-10-CM | POA: Diagnosis not present

## 2016-08-28 DIAGNOSIS — M069 Rheumatoid arthritis, unspecified: Secondary | ICD-10-CM | POA: Diagnosis not present

## 2016-09-12 DIAGNOSIS — Z882 Allergy status to sulfonamides status: Secondary | ICD-10-CM | POA: Diagnosis not present

## 2016-09-12 DIAGNOSIS — J329 Chronic sinusitis, unspecified: Secondary | ICD-10-CM | POA: Diagnosis not present

## 2016-09-12 DIAGNOSIS — R234 Changes in skin texture: Secondary | ICD-10-CM | POA: Diagnosis not present

## 2016-09-12 DIAGNOSIS — J3489 Other specified disorders of nose and nasal sinuses: Secondary | ICD-10-CM | POA: Diagnosis not present

## 2016-09-12 DIAGNOSIS — J328 Other chronic sinusitis: Secondary | ICD-10-CM | POA: Diagnosis not present

## 2016-09-12 DIAGNOSIS — M3130 Wegener's granulomatosis without renal involvement: Secondary | ICD-10-CM | POA: Diagnosis not present

## 2016-09-12 DIAGNOSIS — J343 Hypertrophy of nasal turbinates: Secondary | ICD-10-CM | POA: Diagnosis not present

## 2016-09-12 DIAGNOSIS — J342 Deviated nasal septum: Secondary | ICD-10-CM | POA: Diagnosis not present

## 2016-09-12 DIAGNOSIS — Z79899 Other long term (current) drug therapy: Secondary | ICD-10-CM | POA: Diagnosis not present

## 2016-09-12 DIAGNOSIS — E039 Hypothyroidism, unspecified: Secondary | ICD-10-CM | POA: Diagnosis not present

## 2016-09-12 DIAGNOSIS — J386 Stenosis of larynx: Secondary | ICD-10-CM | POA: Diagnosis not present

## 2016-10-24 DIAGNOSIS — J386 Stenosis of larynx: Secondary | ICD-10-CM | POA: Diagnosis not present

## 2016-10-24 DIAGNOSIS — J398 Other specified diseases of upper respiratory tract: Secondary | ICD-10-CM | POA: Diagnosis not present

## 2016-10-25 ENCOUNTER — Other Ambulatory Visit: Payer: Self-pay | Admitting: Obstetrics and Gynecology

## 2016-10-25 DIAGNOSIS — Z1231 Encounter for screening mammogram for malignant neoplasm of breast: Secondary | ICD-10-CM

## 2016-11-01 DIAGNOSIS — M0589 Other rheumatoid arthritis with rheumatoid factor of multiple sites: Secondary | ICD-10-CM | POA: Diagnosis not present

## 2016-11-01 DIAGNOSIS — M0579 Rheumatoid arthritis with rheumatoid factor of multiple sites without organ or systems involvement: Secondary | ICD-10-CM | POA: Diagnosis not present

## 2016-11-01 DIAGNOSIS — Z23 Encounter for immunization: Secondary | ICD-10-CM | POA: Diagnosis not present

## 2016-11-01 DIAGNOSIS — I776 Arteritis, unspecified: Secondary | ICD-10-CM | POA: Diagnosis not present

## 2016-12-04 ENCOUNTER — Ambulatory Visit
Admission: RE | Admit: 2016-12-04 | Discharge: 2016-12-04 | Disposition: A | Discharge status: Home / Self Care | Payer: BLUE CROSS/BLUE SHIELD | Source: Ambulatory Visit | Attending: Obstetrics and Gynecology | Admitting: Obstetrics and Gynecology

## 2016-12-04 DIAGNOSIS — Z1231 Encounter for screening mammogram for malignant neoplasm of breast: Secondary | ICD-10-CM | POA: Diagnosis not present

## 2016-12-07 DIAGNOSIS — Z6830 Body mass index (BMI) 30.0-30.9, adult: Secondary | ICD-10-CM | POA: Diagnosis not present

## 2016-12-07 DIAGNOSIS — Z124 Encounter for screening for malignant neoplasm of cervix: Secondary | ICD-10-CM | POA: Diagnosis not present

## 2016-12-07 DIAGNOSIS — Z01419 Encounter for gynecological examination (general) (routine) without abnormal findings: Secondary | ICD-10-CM | POA: Diagnosis not present

## 2016-12-07 DIAGNOSIS — E559 Vitamin D deficiency, unspecified: Secondary | ICD-10-CM | POA: Diagnosis not present

## 2017-01-15 DIAGNOSIS — E890 Postprocedural hypothyroidism: Secondary | ICD-10-CM | POA: Diagnosis not present

## 2017-01-16 DIAGNOSIS — E890 Postprocedural hypothyroidism: Secondary | ICD-10-CM | POA: Diagnosis not present

## 2017-01-16 DIAGNOSIS — R7309 Other abnormal glucose: Secondary | ICD-10-CM | POA: Diagnosis not present

## 2017-01-24 DIAGNOSIS — E559 Vitamin D deficiency, unspecified: Secondary | ICD-10-CM | POA: Diagnosis not present

## 2017-02-20 DIAGNOSIS — N6310 Unspecified lump in the right breast, unspecified quadrant: Secondary | ICD-10-CM | POA: Diagnosis not present

## 2017-02-21 DIAGNOSIS — N63 Unspecified lump in unspecified breast: Secondary | ICD-10-CM | POA: Diagnosis not present

## 2017-02-21 DIAGNOSIS — Z1231 Encounter for screening mammogram for malignant neoplasm of breast: Secondary | ICD-10-CM | POA: Diagnosis not present

## 2017-03-20 DIAGNOSIS — J386 Stenosis of larynx: Secondary | ICD-10-CM | POA: Diagnosis not present

## 2017-03-20 DIAGNOSIS — Z930 Tracheostomy status: Secondary | ICD-10-CM | POA: Diagnosis not present

## 2017-03-20 DIAGNOSIS — M3130 Wegener's granulomatosis without renal involvement: Secondary | ICD-10-CM | POA: Diagnosis not present

## 2017-06-26 DIAGNOSIS — J3489 Other specified disorders of nose and nasal sinuses: Secondary | ICD-10-CM | POA: Diagnosis not present

## 2017-06-26 DIAGNOSIS — M0579 Rheumatoid arthritis with rheumatoid factor of multiple sites without organ or systems involvement: Secondary | ICD-10-CM | POA: Diagnosis not present

## 2017-06-26 DIAGNOSIS — I776 Arteritis, unspecified: Secondary | ICD-10-CM | POA: Diagnosis not present

## 2017-06-26 DIAGNOSIS — J386 Stenosis of larynx: Secondary | ICD-10-CM | POA: Diagnosis not present

## 2017-06-26 DIAGNOSIS — M3130 Wegener's granulomatosis without renal involvement: Secondary | ICD-10-CM | POA: Diagnosis not present

## 2017-07-19 DIAGNOSIS — M0579 Rheumatoid arthritis with rheumatoid factor of multiple sites without organ or systems involvement: Secondary | ICD-10-CM | POA: Diagnosis not present

## 2017-07-19 DIAGNOSIS — R05 Cough: Secondary | ICD-10-CM | POA: Diagnosis not present

## 2017-07-19 DIAGNOSIS — M3130 Wegener's granulomatosis without renal involvement: Secondary | ICD-10-CM | POA: Diagnosis not present

## 2017-07-19 DIAGNOSIS — I776 Arteritis, unspecified: Secondary | ICD-10-CM | POA: Diagnosis not present

## 2017-07-19 DIAGNOSIS — Z79899 Other long term (current) drug therapy: Secondary | ICD-10-CM | POA: Diagnosis not present

## 2017-08-16 DIAGNOSIS — J386 Stenosis of larynx: Secondary | ICD-10-CM | POA: Diagnosis not present

## 2017-08-16 DIAGNOSIS — M3130 Wegener's granulomatosis without renal involvement: Secondary | ICD-10-CM | POA: Diagnosis not present

## 2017-08-16 DIAGNOSIS — J398 Other specified diseases of upper respiratory tract: Secondary | ICD-10-CM | POA: Diagnosis not present

## 2017-09-13 DIAGNOSIS — M3130 Wegener's granulomatosis without renal involvement: Secondary | ICD-10-CM | POA: Diagnosis not present

## 2017-09-13 DIAGNOSIS — Z79899 Other long term (current) drug therapy: Secondary | ICD-10-CM | POA: Diagnosis not present

## 2017-09-13 DIAGNOSIS — M0579 Rheumatoid arthritis with rheumatoid factor of multiple sites without organ or systems involvement: Secondary | ICD-10-CM | POA: Diagnosis not present

## 2017-11-23 DIAGNOSIS — M0579 Rheumatoid arthritis with rheumatoid factor of multiple sites without organ or systems involvement: Secondary | ICD-10-CM | POA: Diagnosis not present

## 2017-11-23 DIAGNOSIS — M3130 Wegener's granulomatosis without renal involvement: Secondary | ICD-10-CM | POA: Diagnosis not present

## 2017-12-06 DIAGNOSIS — M0579 Rheumatoid arthritis with rheumatoid factor of multiple sites without organ or systems involvement: Secondary | ICD-10-CM | POA: Diagnosis not present

## 2017-12-06 DIAGNOSIS — I776 Arteritis, unspecified: Secondary | ICD-10-CM | POA: Diagnosis not present

## 2017-12-11 DIAGNOSIS — Z124 Encounter for screening for malignant neoplasm of cervix: Secondary | ICD-10-CM | POA: Diagnosis not present

## 2017-12-11 DIAGNOSIS — Z113 Encounter for screening for infections with a predominantly sexual mode of transmission: Secondary | ICD-10-CM | POA: Diagnosis not present

## 2017-12-11 DIAGNOSIS — A499 Bacterial infection, unspecified: Secondary | ICD-10-CM | POA: Diagnosis not present

## 2017-12-11 DIAGNOSIS — Z01419 Encounter for gynecological examination (general) (routine) without abnormal findings: Secondary | ICD-10-CM | POA: Diagnosis not present

## 2017-12-11 DIAGNOSIS — Z6831 Body mass index (BMI) 31.0-31.9, adult: Secondary | ICD-10-CM | POA: Diagnosis not present

## 2018-02-15 DIAGNOSIS — H18602 Keratoconus, unspecified, left eye: Secondary | ICD-10-CM | POA: Diagnosis not present

## 2018-02-15 DIAGNOSIS — H04123 Dry eye syndrome of bilateral lacrimal glands: Secondary | ICD-10-CM | POA: Diagnosis not present

## 2018-02-15 DIAGNOSIS — H524 Presbyopia: Secondary | ICD-10-CM | POA: Diagnosis not present

## 2018-03-25 DIAGNOSIS — N6019 Diffuse cystic mastopathy of unspecified breast: Secondary | ICD-10-CM | POA: Diagnosis not present

## 2018-04-17 DIAGNOSIS — E890 Postprocedural hypothyroidism: Secondary | ICD-10-CM | POA: Diagnosis not present

## 2018-04-17 DIAGNOSIS — R7309 Other abnormal glucose: Secondary | ICD-10-CM | POA: Diagnosis not present

## 2018-04-24 DIAGNOSIS — E890 Postprocedural hypothyroidism: Secondary | ICD-10-CM | POA: Diagnosis not present

## 2018-04-30 DIAGNOSIS — Z0000 Encounter for general adult medical examination without abnormal findings: Secondary | ICD-10-CM | POA: Diagnosis not present

## 2018-04-30 DIAGNOSIS — J309 Allergic rhinitis, unspecified: Secondary | ICD-10-CM | POA: Diagnosis not present

## 2018-04-30 DIAGNOSIS — M069 Rheumatoid arthritis, unspecified: Secondary | ICD-10-CM | POA: Diagnosis not present

## 2018-04-30 DIAGNOSIS — M3130 Wegener's granulomatosis without renal involvement: Secondary | ICD-10-CM | POA: Diagnosis not present

## 2018-04-30 DIAGNOSIS — Z13220 Encounter for screening for lipoid disorders: Secondary | ICD-10-CM | POA: Diagnosis not present

## 2018-04-30 DIAGNOSIS — K5900 Constipation, unspecified: Secondary | ICD-10-CM | POA: Diagnosis not present

## 2018-06-25 DIAGNOSIS — E890 Postprocedural hypothyroidism: Secondary | ICD-10-CM | POA: Diagnosis not present

## 2018-07-02 DIAGNOSIS — I776 Arteritis, unspecified: Secondary | ICD-10-CM | POA: Diagnosis not present

## 2018-07-02 DIAGNOSIS — M0579 Rheumatoid arthritis with rheumatoid factor of multiple sites without organ or systems involvement: Secondary | ICD-10-CM | POA: Diagnosis not present

## 2018-07-02 DIAGNOSIS — M317 Microscopic polyangiitis: Secondary | ICD-10-CM | POA: Diagnosis not present

## 2018-07-02 DIAGNOSIS — M170 Bilateral primary osteoarthritis of knee: Secondary | ICD-10-CM | POA: Diagnosis not present

## 2018-07-07 DIAGNOSIS — J0100 Acute maxillary sinusitis, unspecified: Secondary | ICD-10-CM | POA: Diagnosis not present

## 2018-11-08 DIAGNOSIS — I776 Arteritis, unspecified: Secondary | ICD-10-CM | POA: Diagnosis not present

## 2018-11-08 DIAGNOSIS — M0579 Rheumatoid arthritis with rheumatoid factor of multiple sites without organ or systems involvement: Secondary | ICD-10-CM | POA: Diagnosis not present

## 2018-12-31 DIAGNOSIS — M170 Bilateral primary osteoarthritis of knee: Secondary | ICD-10-CM | POA: Diagnosis not present

## 2018-12-31 DIAGNOSIS — M0579 Rheumatoid arthritis with rheumatoid factor of multiple sites without organ or systems involvement: Secondary | ICD-10-CM | POA: Diagnosis not present

## 2018-12-31 DIAGNOSIS — M7062 Trochanteric bursitis, left hip: Secondary | ICD-10-CM | POA: Diagnosis not present

## 2018-12-31 DIAGNOSIS — I776 Arteritis, unspecified: Secondary | ICD-10-CM | POA: Diagnosis not present

## 2019-02-04 DIAGNOSIS — Z124 Encounter for screening for malignant neoplasm of cervix: Secondary | ICD-10-CM | POA: Diagnosis not present

## 2019-02-04 DIAGNOSIS — E079 Disorder of thyroid, unspecified: Secondary | ICD-10-CM | POA: Diagnosis not present

## 2019-02-04 DIAGNOSIS — Z01419 Encounter for gynecological examination (general) (routine) without abnormal findings: Secondary | ICD-10-CM | POA: Diagnosis not present

## 2019-02-04 DIAGNOSIS — E559 Vitamin D deficiency, unspecified: Secondary | ICD-10-CM | POA: Diagnosis not present

## 2019-02-04 DIAGNOSIS — Z139 Encounter for screening, unspecified: Secondary | ICD-10-CM | POA: Diagnosis not present

## 2019-02-04 DIAGNOSIS — N898 Other specified noninflammatory disorders of vagina: Secondary | ICD-10-CM | POA: Diagnosis not present

## 2019-04-01 DIAGNOSIS — Z1231 Encounter for screening mammogram for malignant neoplasm of breast: Secondary | ICD-10-CM | POA: Diagnosis not present

## 2019-04-01 DIAGNOSIS — N6019 Diffuse cystic mastopathy of unspecified breast: Secondary | ICD-10-CM | POA: Diagnosis not present

## 2019-04-11 DIAGNOSIS — M0579 Rheumatoid arthritis with rheumatoid factor of multiple sites without organ or systems involvement: Secondary | ICD-10-CM | POA: Diagnosis not present

## 2019-04-11 DIAGNOSIS — I776 Arteritis, unspecified: Secondary | ICD-10-CM | POA: Diagnosis not present

## 2019-04-21 DIAGNOSIS — M13829 Other specified arthritis, unspecified elbow: Secondary | ICD-10-CM | POA: Diagnosis not present

## 2019-04-21 DIAGNOSIS — M13822 Other specified arthritis, left elbow: Secondary | ICD-10-CM | POA: Diagnosis not present

## 2019-04-21 DIAGNOSIS — M25522 Pain in left elbow: Secondary | ICD-10-CM | POA: Diagnosis not present

## 2019-04-21 DIAGNOSIS — M069 Rheumatoid arthritis, unspecified: Secondary | ICD-10-CM | POA: Diagnosis not present

## 2019-04-24 DIAGNOSIS — R7301 Impaired fasting glucose: Secondary | ICD-10-CM | POA: Diagnosis not present

## 2019-04-24 DIAGNOSIS — E890 Postprocedural hypothyroidism: Secondary | ICD-10-CM | POA: Diagnosis not present

## 2019-04-30 DIAGNOSIS — E890 Postprocedural hypothyroidism: Secondary | ICD-10-CM | POA: Diagnosis not present

## 2019-04-30 DIAGNOSIS — R7301 Impaired fasting glucose: Secondary | ICD-10-CM | POA: Diagnosis not present

## 2019-05-01 ENCOUNTER — Other Ambulatory Visit: Payer: Self-pay | Admitting: Radiology

## 2019-05-01 DIAGNOSIS — N6002 Solitary cyst of left breast: Secondary | ICD-10-CM | POA: Diagnosis not present

## 2019-05-01 DIAGNOSIS — R590 Localized enlarged lymph nodes: Secondary | ICD-10-CM | POA: Diagnosis not present

## 2019-05-01 DIAGNOSIS — R599 Enlarged lymph nodes, unspecified: Secondary | ICD-10-CM | POA: Diagnosis not present

## 2019-05-02 DIAGNOSIS — Z0000 Encounter for general adult medical examination without abnormal findings: Secondary | ICD-10-CM | POA: Diagnosis not present

## 2019-05-19 DIAGNOSIS — M13822 Other specified arthritis, left elbow: Secondary | ICD-10-CM | POA: Diagnosis not present

## 2019-07-01 DIAGNOSIS — M0579 Rheumatoid arthritis with rheumatoid factor of multiple sites without organ or systems involvement: Secondary | ICD-10-CM | POA: Diagnosis not present

## 2019-07-01 DIAGNOSIS — I776 Arteritis, unspecified: Secondary | ICD-10-CM | POA: Diagnosis not present

## 2019-07-03 DIAGNOSIS — M0579 Rheumatoid arthritis with rheumatoid factor of multiple sites without organ or systems involvement: Secondary | ICD-10-CM | POA: Diagnosis not present

## 2019-07-03 DIAGNOSIS — Z79899 Other long term (current) drug therapy: Secondary | ICD-10-CM | POA: Diagnosis not present

## 2019-07-03 DIAGNOSIS — I776 Arteritis, unspecified: Secondary | ICD-10-CM | POA: Diagnosis not present

## 2019-07-03 DIAGNOSIS — Z111 Encounter for screening for respiratory tuberculosis: Secondary | ICD-10-CM | POA: Diagnosis not present

## 2019-07-03 DIAGNOSIS — M069 Rheumatoid arthritis, unspecified: Secondary | ICD-10-CM | POA: Diagnosis not present

## 2019-10-01 DIAGNOSIS — M2550 Pain in unspecified joint: Secondary | ICD-10-CM | POA: Diagnosis not present

## 2019-10-01 DIAGNOSIS — E669 Obesity, unspecified: Secondary | ICD-10-CM | POA: Diagnosis not present

## 2019-10-01 DIAGNOSIS — M3130 Wegener's granulomatosis without renal involvement: Secondary | ICD-10-CM | POA: Diagnosis not present

## 2019-10-01 DIAGNOSIS — M0579 Rheumatoid arthritis with rheumatoid factor of multiple sites without organ or systems involvement: Secondary | ICD-10-CM | POA: Diagnosis not present

## 2019-10-15 DIAGNOSIS — M3130 Wegener's granulomatosis without renal involvement: Secondary | ICD-10-CM | POA: Diagnosis not present

## 2019-10-15 DIAGNOSIS — M2550 Pain in unspecified joint: Secondary | ICD-10-CM | POA: Diagnosis not present

## 2019-10-15 DIAGNOSIS — M0579 Rheumatoid arthritis with rheumatoid factor of multiple sites without organ or systems involvement: Secondary | ICD-10-CM | POA: Diagnosis not present

## 2019-10-15 DIAGNOSIS — Z6829 Body mass index (BMI) 29.0-29.9, adult: Secondary | ICD-10-CM | POA: Diagnosis not present

## 2019-12-26 DIAGNOSIS — H524 Presbyopia: Secondary | ICD-10-CM | POA: Diagnosis not present

## 2019-12-26 DIAGNOSIS — H18602 Keratoconus, unspecified, left eye: Secondary | ICD-10-CM | POA: Diagnosis not present

## 2019-12-30 DIAGNOSIS — M0579 Rheumatoid arthritis with rheumatoid factor of multiple sites without organ or systems involvement: Secondary | ICD-10-CM | POA: Diagnosis not present

## 2019-12-30 DIAGNOSIS — M7062 Trochanteric bursitis, left hip: Secondary | ICD-10-CM | POA: Diagnosis not present

## 2019-12-30 DIAGNOSIS — I776 Arteritis, unspecified: Secondary | ICD-10-CM | POA: Diagnosis not present

## 2020-03-15 DIAGNOSIS — J029 Acute pharyngitis, unspecified: Secondary | ICD-10-CM | POA: Diagnosis not present

## 2020-03-15 DIAGNOSIS — Z20818 Contact with and (suspected) exposure to other bacterial communicable diseases: Secondary | ICD-10-CM | POA: Diagnosis not present

## 2020-04-09 DIAGNOSIS — M0589 Other rheumatoid arthritis with rheumatoid factor of multiple sites: Secondary | ICD-10-CM | POA: Diagnosis not present

## 2020-04-09 DIAGNOSIS — I776 Arteritis, unspecified: Secondary | ICD-10-CM | POA: Diagnosis not present

## 2020-04-09 DIAGNOSIS — I7789 Other specified disorders of arteries and arterioles: Secondary | ICD-10-CM | POA: Diagnosis not present

## 2020-04-09 DIAGNOSIS — J3489 Other specified disorders of nose and nasal sinuses: Secondary | ICD-10-CM | POA: Diagnosis not present

## 2020-04-09 DIAGNOSIS — M0579 Rheumatoid arthritis with rheumatoid factor of multiple sites without organ or systems involvement: Secondary | ICD-10-CM | POA: Diagnosis not present

## 2020-04-09 DIAGNOSIS — J386 Stenosis of larynx: Secondary | ICD-10-CM | POA: Diagnosis not present

## 2020-04-09 DIAGNOSIS — R040 Epistaxis: Secondary | ICD-10-CM | POA: Diagnosis not present

## 2020-04-09 DIAGNOSIS — Z79899 Other long term (current) drug therapy: Secondary | ICD-10-CM | POA: Diagnosis not present

## 2020-04-28 DIAGNOSIS — E890 Postprocedural hypothyroidism: Secondary | ICD-10-CM | POA: Diagnosis not present

## 2020-04-28 DIAGNOSIS — R7301 Impaired fasting glucose: Secondary | ICD-10-CM | POA: Diagnosis not present

## 2020-05-04 DIAGNOSIS — Z1231 Encounter for screening mammogram for malignant neoplasm of breast: Secondary | ICD-10-CM | POA: Diagnosis not present

## 2020-05-28 DIAGNOSIS — M3130 Wegener's granulomatosis without renal involvement: Secondary | ICD-10-CM | POA: Diagnosis not present

## 2020-06-03 DIAGNOSIS — I776 Arteritis, unspecified: Secondary | ICD-10-CM | POA: Diagnosis not present

## 2020-06-03 DIAGNOSIS — J386 Stenosis of larynx: Secondary | ICD-10-CM | POA: Diagnosis not present

## 2020-06-11 DIAGNOSIS — M3130 Wegener's granulomatosis without renal involvement: Secondary | ICD-10-CM | POA: Diagnosis not present

## 2020-07-06 DIAGNOSIS — K625 Hemorrhage of anus and rectum: Secondary | ICD-10-CM | POA: Diagnosis not present

## 2020-07-06 DIAGNOSIS — Z1211 Encounter for screening for malignant neoplasm of colon: Secondary | ICD-10-CM | POA: Diagnosis not present

## 2020-07-06 DIAGNOSIS — I776 Arteritis, unspecified: Secondary | ICD-10-CM | POA: Diagnosis not present

## 2020-07-06 DIAGNOSIS — M0579 Rheumatoid arthritis with rheumatoid factor of multiple sites without organ or systems involvement: Secondary | ICD-10-CM | POA: Diagnosis not present

## 2020-07-06 DIAGNOSIS — E669 Obesity, unspecified: Secondary | ICD-10-CM | POA: Diagnosis not present

## 2020-07-06 DIAGNOSIS — K5904 Chronic idiopathic constipation: Secondary | ICD-10-CM | POA: Diagnosis not present

## 2020-07-09 ENCOUNTER — Other Ambulatory Visit: Payer: Self-pay | Admitting: Gastroenterology

## 2020-07-19 DIAGNOSIS — M0579 Rheumatoid arthritis with rheumatoid factor of multiple sites without organ or systems involvement: Secondary | ICD-10-CM | POA: Diagnosis not present

## 2020-07-19 DIAGNOSIS — Z79899 Other long term (current) drug therapy: Secondary | ICD-10-CM | POA: Diagnosis not present

## 2020-07-19 DIAGNOSIS — I776 Arteritis, unspecified: Secondary | ICD-10-CM | POA: Diagnosis not present

## 2020-08-24 ENCOUNTER — Other Ambulatory Visit: Payer: Self-pay

## 2020-08-24 ENCOUNTER — Other Ambulatory Visit (HOSPITAL_COMMUNITY)
Admission: RE | Admit: 2020-08-24 | Discharge: 2020-08-24 | Disposition: A | Discharge status: Home / Self Care | Payer: BC Managed Care – PPO | Source: Ambulatory Visit | Attending: Gastroenterology | Admitting: Gastroenterology

## 2020-08-24 ENCOUNTER — Encounter (HOSPITAL_COMMUNITY): Payer: Self-pay | Admitting: Gastroenterology

## 2020-08-24 DIAGNOSIS — K5909 Other constipation: Secondary | ICD-10-CM | POA: Diagnosis not present

## 2020-08-24 DIAGNOSIS — Z20822 Contact with and (suspected) exposure to covid-19: Secondary | ICD-10-CM | POA: Insufficient documentation

## 2020-08-24 DIAGNOSIS — R140 Abdominal distension (gaseous): Secondary | ICD-10-CM | POA: Diagnosis not present

## 2020-08-24 DIAGNOSIS — Z01812 Encounter for preprocedural laboratory examination: Secondary | ICD-10-CM | POA: Insufficient documentation

## 2020-08-24 DIAGNOSIS — Z1211 Encounter for screening for malignant neoplasm of colon: Secondary | ICD-10-CM | POA: Diagnosis not present

## 2020-08-24 DIAGNOSIS — Z8379 Family history of other diseases of the digestive system: Secondary | ICD-10-CM | POA: Diagnosis not present

## 2020-08-24 DIAGNOSIS — Z882 Allergy status to sulfonamides status: Secondary | ICD-10-CM | POA: Diagnosis not present

## 2020-08-25 LAB — SARS CORONAVIRUS 2 (TAT 6-24 HRS): SARS Coronavirus 2: NEGATIVE

## 2020-08-27 ENCOUNTER — Ambulatory Visit (HOSPITAL_COMMUNITY)
Admission: RE | Admit: 2020-08-27 | Discharge: 2020-08-27 | Disposition: A | Discharge status: Home / Self Care | Payer: BC Managed Care – PPO | Attending: Gastroenterology | Admitting: Gastroenterology

## 2020-08-27 ENCOUNTER — Encounter (HOSPITAL_COMMUNITY): Payer: Self-pay | Admitting: Gastroenterology

## 2020-08-27 ENCOUNTER — Ambulatory Visit (HOSPITAL_COMMUNITY): Payer: BC Managed Care – PPO | Admitting: Anesthesiology

## 2020-08-27 ENCOUNTER — Other Ambulatory Visit: Payer: Self-pay

## 2020-08-27 ENCOUNTER — Encounter (HOSPITAL_COMMUNITY)
Admission: RE | Disposition: A | Discharge status: Home / Self Care | Payer: Self-pay | Source: Home / Self Care | Attending: Gastroenterology

## 2020-08-27 DIAGNOSIS — Z20822 Contact with and (suspected) exposure to covid-19: Secondary | ICD-10-CM | POA: Diagnosis not present

## 2020-08-27 DIAGNOSIS — Z1211 Encounter for screening for malignant neoplasm of colon: Secondary | ICD-10-CM | POA: Insufficient documentation

## 2020-08-27 DIAGNOSIS — Z8379 Family history of other diseases of the digestive system: Secondary | ICD-10-CM | POA: Insufficient documentation

## 2020-08-27 DIAGNOSIS — K5909 Other constipation: Secondary | ICD-10-CM | POA: Insufficient documentation

## 2020-08-27 DIAGNOSIS — Z882 Allergy status to sulfonamides status: Secondary | ICD-10-CM | POA: Diagnosis not present

## 2020-08-27 DIAGNOSIS — M069 Rheumatoid arthritis, unspecified: Secondary | ICD-10-CM | POA: Diagnosis not present

## 2020-08-27 DIAGNOSIS — E039 Hypothyroidism, unspecified: Secondary | ICD-10-CM | POA: Diagnosis not present

## 2020-08-27 DIAGNOSIS — R140 Abdominal distension (gaseous): Secondary | ICD-10-CM | POA: Diagnosis not present

## 2020-08-27 DIAGNOSIS — J386 Stenosis of larynx: Secondary | ICD-10-CM | POA: Diagnosis not present

## 2020-08-27 HISTORY — DX: Presence of spectacles and contact lenses: Z97.3

## 2020-08-27 HISTORY — PX: COLONOSCOPY WITH PROPOFOL: SHX5780

## 2020-08-27 SURGERY — COLONOSCOPY WITH PROPOFOL
Anesthesia: Monitor Anesthesia Care

## 2020-08-27 MED ORDER — PROPOFOL 10 MG/ML IV BOLUS
INTRAVENOUS | Status: DC | PRN
  Administered 2020-08-27: 40 mg via INTRAVENOUS

## 2020-08-27 MED ORDER — LACTATED RINGERS IV SOLN: INTRAVENOUS | Status: DC

## 2020-08-27 MED ORDER — PROPOFOL 10 MG/ML IV BOLUS
INTRAVENOUS | Status: AC
  Filled 2020-08-27: qty 20

## 2020-08-27 MED ORDER — ONDANSETRON HCL 4 MG/2ML IJ SOLN
INTRAMUSCULAR | Status: DC | PRN
  Administered 2020-08-27: 4 mg via INTRAVENOUS

## 2020-08-27 MED ORDER — LIDOCAINE 2% (20 MG/ML) 5 ML SYRINGE
INTRAMUSCULAR | Status: DC | PRN
  Administered 2020-08-27: 80 mg via INTRAVENOUS

## 2020-08-27 MED ORDER — PROPOFOL 500 MG/50ML IV EMUL
INTRAVENOUS | Status: DC | PRN
  Administered 2020-08-27: 125 ug/kg/min via INTRAVENOUS

## 2020-08-27 SURGICAL SUPPLY — 22 items

## 2020-08-27 NOTE — Discharge Instructions (Signed)

## 2020-08-27 NOTE — Anesthesia Preprocedure Evaluation (Addendum)
Anesthesia Evaluation  Patient identified by MRN, date of birth, ID band Patient awake    Reviewed: Allergy & Precautions, NPO status , Patient's Chart, lab work & pertinent test results  History of Anesthesia Complications Negative for: history of anesthetic complications  Airway Mallampati: II  TM Distance: >3 FB Neck ROM: Full    Dental  (+) Teeth Intact   Pulmonary asthma ,    Pulmonary exam normal        Cardiovascular negative cardio ROS Normal cardiovascular exam     Neuro/Psych negative neurological ROS     GI/Hepatic negative GI ROS, Neg liver ROS,   Endo/Other  Hypothyroidism   Renal/GU negative Renal ROS  negative genitourinary   Musculoskeletal  (+) Arthritis , Rheumatoid disorders,    Abdominal   Peds  Hematology negative hematology ROS (+)   Anesthesia Other Findings  H/o Wegener's granulomatosis with tracheal stenosis   Reproductive/Obstetrics                            Anesthesia Physical Anesthesia Plan  ASA: II  Anesthesia Plan: MAC   Post-op Pain Management:    Induction: Intravenous  PONV Risk Score and Plan: 2 and Propofol infusion, TIVA and Treatment may vary due to age or medical condition  Airway Management Planned: Natural Airway, Nasal Cannula and Simple Face Mask  Additional Equipment: None  Intra-op Plan:   Post-operative Plan:   Informed Consent: I have reviewed the patients History and Physical, chart, labs and discussed the procedure including the risks, benefits and alternatives for the proposed anesthesia with the patient or authorized representative who has indicated his/her understanding and acceptance.       Plan Discussed with:   Anesthesia Plan Comments:         Anesthesia Quick Evaluation

## 2020-08-27 NOTE — Anesthesia Postprocedure Evaluation (Signed)
Anesthesia Post Note  Patient: Laurie Dyer  Procedure(s) Performed: COLONOSCOPY WITH PROPOFOL (N/A )     Patient location during evaluation: Endoscopy Anesthesia Type: MAC Level of consciousness: awake and alert Pain management: pain level controlled Vital Signs Assessment: post-procedure vital signs reviewed and stable Respiratory status: spontaneous breathing, nonlabored ventilation and respiratory function stable Cardiovascular status: blood pressure returned to baseline and stable Postop Assessment: no apparent nausea or vomiting Anesthetic complications: no   No complications documented.  Last Vitals:  Vitals:   08/27/20 1250 08/27/20 1300  BP: (!) 100/59 (!) 103/44  Pulse:    Resp: 16 16  Temp:    SpO2: 95% 97%    Last Pain:  Vitals:   08/27/20 1300  TempSrc:   PainSc: 0-No pain                 Lidia Collum

## 2020-08-27 NOTE — Op Note (Signed)
[facility] Patient Name: Laurie Dyer Procedure Date: 08/27/2020 MRN: [id number] Attending MD: Jeani Hawking , MD Date of Birth: 1970-09-24 CSN: [id number] Age: 50 Admit Type: Outpatient Procedure:                Colonoscopy Indications:              Screening for colorectal malignant neoplasm Providers:                Jeani Hawking, MD, Clearnce Sorrel, RN, Beryle Beams,                            Technician Referring MD:              Medicines:                Propofol per Anesthesia Complications:            No immediate complications. Estimated Blood Loss:     Estimated blood loss: none. Procedure:                Pre-Anesthesia Assessment:                           - Prior to the procedure, a History and Physical                            was performed, and patient medications and                            allergies were reviewed. The patient's tolerance of                            previous anesthesia was also reviewed. The risks                            and benefits of the procedure and the sedation                            options and risks were discussed with the patient.                            All questions were answered, and informed consent                            was obtained. Prior Anticoagulants: The patient has                            taken no previous anticoagulant or antiplatelet                            agents. ASA Grade Assessment: II - A patient with                            mild systemic disease. After reviewing the risks  and benefits, the patient was deemed in                            satisfactory condition to undergo the procedure.                           - Sedation was administered by an anesthesia                            professional. Deep sedation was attained.                           After obtaining informed consent, the colonoscope                            was passed under direct  vision. Throughout the                            procedure, the patient's blood pressure, pulse, and                            oxygen saturations were monitored continuously. The                            CF-HQ190L ([id number]) Olympus colonoscope was                            introduced through the anus and advanced to the the                            cecum, identified by appendiceal orifice and                            ileocecal valve. The colonoscopy was performed                            without difficulty. The patient tolerated the                            procedure well. The quality of the bowel                            preparation was good. The ileocecal valve,                            appendiceal orifice, and rectum were photographed. Scope In: 12:18:10 PM Scope Out: 12:36:02 PM Scope Withdrawal Time: 0 hours 9 minutes 19 seconds  Total Procedure Duration: 0 hours 17 minutes 52 seconds  Findings:      The entire examined colon appeared normal. Impression:               - The entire examined colon is normal.                           - No specimens collected. Moderate Sedation:  Not Applicable - Patient had care per Anesthesia. Recommendation:           - Patient has a contact number available for                            emergencies. The signs and symptoms of potential                            delayed complications were discussed with the                            patient. Return to normal activities tomorrow.                            Written discharge instructions were provided to the                            patient.                           - Resume previous diet.                           - Continue present medications.                           - Repeat colonoscopy in 10 years for screening                            purposes. Procedure Code(s):        --- Professional ---                           [phone], Colonoscopy, flexible; diagnostic,  including                            collection of specimen(s) by brushing or washing,                            when performed (separate procedure) Diagnosis Code(s):        --- Professional ---                           Z12.11, Encounter for screening for malignant                            neoplasm of colon CPT copyright 2019 [employer]. All rights reserved. The codes documented in this report are preliminary and upon coder review may  be revised to meet current compliance requirements. Jeani Hawking, MD Jeani Hawking, MD 08/27/2020 12:45:19 PM This report has been signed electronically. Number of Addenda: 0

## 2020-08-27 NOTE — H&P (Signed)
  Laurie Dyer HPI: This 50 year old black female presents to the office for colorectal cancer screening. She has chronic constipation and has  1 BM every 10 days. She has occasional bright red blood on the toilet tissue that she feels is from her hemorrhoids. She has a lot of gas and bloating. She has good appetite and her weight has been stable. She denies having any complaints of abdominal pain, nausea, vomiting, acid reflux, dysphagia or odynophagia. She denies having a family history of colon cancer or celiac sprue. Her father has Crohn's disease.  Past Medical History:  Diagnosis Date  . Asthma   . Dyspnea   . Rheumatoid arthritis(714.0)   . Rhinitis   . Thyroid disease   . Tracheal stenosis   . Wagner syndrome   . Wears glasses     Past Surgical History:  Procedure Laterality Date  . nasal mucosal    . rhinoscopy    . TRACHEAL DILITATION    . TRACHEOSTOMY    . TRACHEOSTOMY CLOSURE      Family History  Problem Relation Age of Onset  . Diabetes Father   . Breast cancer Mother     Social History:  reports that she has never smoked. She does not have any smokeless tobacco history on file. She reports that she does not drink alcohol and does not use drugs.  Allergies:  Allergies  Allergen Reactions  . Sulfonamide Derivatives Rash    Medications:  Scheduled:  Continuous: . lactated ringers      No results found for this or any previous visit (from the past 24 hour(s)).   No results found.  ROS:  As stated above in the HPI otherwise negative.  Height 5\' 6"  (1.676 m), weight 81.6 kg.    PE: Gen: NAD, Alert and Oriented HEENT:  [place]/AT, EOMI Neck: Supple, no LAD Lungs: CTA Bilaterally CV: RRR without M/G/R ABD: Soft, NTND, +BS Ext: No C/C/E  Assessment/Plan: 1) Screening colonoscopy  Tonie Vizcarrondo D 08/27/2020, 10:37 AM

## 2020-08-27 NOTE — Transfer of Care (Signed)
Immediate Anesthesia Transfer of Care Note  Patient: Laurie Dyer  Procedure(s) Performed: COLONOSCOPY WITH PROPOFOL (N/A )  Patient Location: PACU and Endoscopy Unit  Anesthesia Type:MAC  Level of Consciousness: awake, alert  and oriented  Airway & Oxygen Therapy: Patient Spontanous Breathing and Patient connected to face mask oxygen  Post-op Assessment: Report given to RN and Post -op Vital signs reviewed and stable  Post vital signs: Reviewed and stable  Last Vitals:  Vitals Value Taken Time  BP    Temp    Pulse    Resp    SpO2      Last Pain:  Vitals:   08/27/20 1045  TempSrc: Oral  PainSc: 0-No pain         Complications: No complications documented.

## 2020-08-30 ENCOUNTER — Encounter (HOSPITAL_COMMUNITY): Payer: Self-pay | Admitting: Gastroenterology

## 2020-10-05 DIAGNOSIS — J386 Stenosis of larynx: Secondary | ICD-10-CM | POA: Diagnosis not present

## 2020-10-05 DIAGNOSIS — J384 Edema of larynx: Secondary | ICD-10-CM | POA: Diagnosis not present

## 2020-10-05 DIAGNOSIS — J383 Other diseases of vocal cords: Secondary | ICD-10-CM | POA: Diagnosis not present

## 2020-10-05 DIAGNOSIS — J398 Other specified diseases of upper respiratory tract: Secondary | ICD-10-CM | POA: Diagnosis not present

## 2020-10-29 DIAGNOSIS — Z79899 Other long term (current) drug therapy: Secondary | ICD-10-CM | POA: Diagnosis not present

## 2020-10-29 DIAGNOSIS — I776 Arteritis, unspecified: Secondary | ICD-10-CM | POA: Diagnosis not present

## 2020-12-20 DIAGNOSIS — M3130 Wegener's granulomatosis without renal involvement: Secondary | ICD-10-CM | POA: Diagnosis not present

## 2020-12-20 DIAGNOSIS — Z79899 Other long term (current) drug therapy: Secondary | ICD-10-CM | POA: Diagnosis not present

## 2020-12-20 DIAGNOSIS — M0579 Rheumatoid arthritis with rheumatoid factor of multiple sites without organ or systems involvement: Secondary | ICD-10-CM | POA: Diagnosis not present

## 2021-01-13 DIAGNOSIS — H04123 Dry eye syndrome of bilateral lacrimal glands: Secondary | ICD-10-CM | POA: Diagnosis not present

## 2021-01-13 DIAGNOSIS — H18602 Keratoconus, unspecified, left eye: Secondary | ICD-10-CM | POA: Diagnosis not present

## 2021-01-21 DIAGNOSIS — M0579 Rheumatoid arthritis with rheumatoid factor of multiple sites without organ or systems involvement: Secondary | ICD-10-CM | POA: Diagnosis not present

## 2021-01-21 DIAGNOSIS — M3130 Wegener's granulomatosis without renal involvement: Secondary | ICD-10-CM | POA: Diagnosis not present

## 2021-02-01 DIAGNOSIS — E039 Hypothyroidism, unspecified: Secondary | ICD-10-CM | POA: Diagnosis not present

## 2021-02-01 DIAGNOSIS — Z0000 Encounter for general adult medical examination without abnormal findings: Secondary | ICD-10-CM | POA: Diagnosis not present

## 2021-02-01 DIAGNOSIS — Z13220 Encounter for screening for lipoid disorders: Secondary | ICD-10-CM | POA: Diagnosis not present

## 2021-02-09 DIAGNOSIS — J384 Edema of larynx: Secondary | ICD-10-CM | POA: Diagnosis not present

## 2021-02-09 DIAGNOSIS — J383 Other diseases of vocal cords: Secondary | ICD-10-CM | POA: Diagnosis not present

## 2021-02-09 DIAGNOSIS — J386 Stenosis of larynx: Secondary | ICD-10-CM | POA: Diagnosis not present

## 2021-03-06 DIAGNOSIS — R030 Elevated blood-pressure reading, without diagnosis of hypertension: Secondary | ICD-10-CM | POA: Diagnosis not present

## 2021-03-06 DIAGNOSIS — D849 Immunodeficiency, unspecified: Secondary | ICD-10-CM | POA: Diagnosis not present

## 2021-03-06 DIAGNOSIS — K047 Periapical abscess without sinus: Secondary | ICD-10-CM | POA: Diagnosis not present

## 2021-03-25 DIAGNOSIS — J398 Other specified diseases of upper respiratory tract: Secondary | ICD-10-CM | POA: Diagnosis not present

## 2021-03-25 DIAGNOSIS — J386 Stenosis of larynx: Secondary | ICD-10-CM | POA: Diagnosis not present

## 2021-03-25 DIAGNOSIS — M3130 Wegener's granulomatosis without renal involvement: Secondary | ICD-10-CM | POA: Diagnosis not present

## 2021-04-21 DIAGNOSIS — R7301 Impaired fasting glucose: Secondary | ICD-10-CM | POA: Diagnosis not present

## 2021-04-21 DIAGNOSIS — E890 Postprocedural hypothyroidism: Secondary | ICD-10-CM | POA: Diagnosis not present

## 2021-04-28 DIAGNOSIS — E890 Postprocedural hypothyroidism: Secondary | ICD-10-CM | POA: Diagnosis not present

## 2021-04-28 DIAGNOSIS — I10 Essential (primary) hypertension: Secondary | ICD-10-CM | POA: Diagnosis not present

## 2021-04-28 DIAGNOSIS — R7301 Impaired fasting glucose: Secondary | ICD-10-CM | POA: Diagnosis not present

## 2021-05-12 DIAGNOSIS — Z79899 Other long term (current) drug therapy: Secondary | ICD-10-CM | POA: Diagnosis not present

## 2021-05-12 DIAGNOSIS — I7782 Antineutrophilic cytoplasmic antibody (ANCA) vasculitis: Secondary | ICD-10-CM | POA: Diagnosis not present

## 2021-06-22 DIAGNOSIS — Z79899 Other long term (current) drug therapy: Secondary | ICD-10-CM | POA: Diagnosis not present

## 2021-06-22 DIAGNOSIS — M3130 Wegener's granulomatosis without renal involvement: Secondary | ICD-10-CM | POA: Diagnosis not present

## 2021-06-22 DIAGNOSIS — M0579 Rheumatoid arthritis with rheumatoid factor of multiple sites without organ or systems involvement: Secondary | ICD-10-CM | POA: Diagnosis not present

## 2021-06-23 DIAGNOSIS — Z1231 Encounter for screening mammogram for malignant neoplasm of breast: Secondary | ICD-10-CM | POA: Diagnosis not present

## 2021-06-28 DIAGNOSIS — Z01419 Encounter for gynecological examination (general) (routine) without abnormal findings: Secondary | ICD-10-CM | POA: Diagnosis not present

## 2021-06-28 DIAGNOSIS — Z124 Encounter for screening for malignant neoplasm of cervix: Secondary | ICD-10-CM | POA: Diagnosis not present

## 2021-06-28 DIAGNOSIS — Z30431 Encounter for routine checking of intrauterine contraceptive device: Secondary | ICD-10-CM | POA: Diagnosis not present

## 2021-07-07 DIAGNOSIS — I10 Essential (primary) hypertension: Secondary | ICD-10-CM | POA: Diagnosis not present

## 2021-07-22 DIAGNOSIS — Z79899 Other long term (current) drug therapy: Secondary | ICD-10-CM | POA: Diagnosis not present

## 2021-07-22 DIAGNOSIS — M3130 Wegener's granulomatosis without renal involvement: Secondary | ICD-10-CM | POA: Diagnosis not present

## 2022-02-03 DIAGNOSIS — Z0000 Encounter for general adult medical examination without abnormal findings: Secondary | ICD-10-CM | POA: Diagnosis not present

## 2022-02-03 DIAGNOSIS — Z13220 Encounter for screening for lipoid disorders: Secondary | ICD-10-CM | POA: Diagnosis not present

## 2022-02-22 DIAGNOSIS — Z79899 Other long term (current) drug therapy: Secondary | ICD-10-CM | POA: Diagnosis not present

## 2022-02-22 DIAGNOSIS — M3130 Wegener's granulomatosis without renal involvement: Secondary | ICD-10-CM | POA: Diagnosis not present

## 2022-02-22 DIAGNOSIS — M0579 Rheumatoid arthritis with rheumatoid factor of multiple sites without organ or systems involvement: Secondary | ICD-10-CM | POA: Diagnosis not present

## 2022-03-07 DIAGNOSIS — Z79899 Other long term (current) drug therapy: Secondary | ICD-10-CM | POA: Diagnosis not present

## 2022-04-21 DIAGNOSIS — Z79620 Long term (current) use of immunosuppressive biologic: Secondary | ICD-10-CM | POA: Diagnosis not present

## 2022-04-21 DIAGNOSIS — M3130 Wegener's granulomatosis without renal involvement: Secondary | ICD-10-CM | POA: Diagnosis not present

## 2022-04-27 DIAGNOSIS — R7301 Impaired fasting glucose: Secondary | ICD-10-CM | POA: Diagnosis not present

## 2022-04-27 DIAGNOSIS — E890 Postprocedural hypothyroidism: Secondary | ICD-10-CM | POA: Diagnosis not present

## 2022-04-27 DIAGNOSIS — I10 Essential (primary) hypertension: Secondary | ICD-10-CM | POA: Diagnosis not present

## 2022-06-01 DIAGNOSIS — I10 Essential (primary) hypertension: Secondary | ICD-10-CM | POA: Diagnosis not present

## 2022-06-01 DIAGNOSIS — R7301 Impaired fasting glucose: Secondary | ICD-10-CM | POA: Diagnosis not present

## 2022-06-01 DIAGNOSIS — E7800 Pure hypercholesterolemia, unspecified: Secondary | ICD-10-CM | POA: Diagnosis not present

## 2022-06-01 DIAGNOSIS — E890 Postprocedural hypothyroidism: Secondary | ICD-10-CM | POA: Diagnosis not present

## 2022-06-27 DIAGNOSIS — Z1231 Encounter for screening mammogram for malignant neoplasm of breast: Secondary | ICD-10-CM | POA: Diagnosis not present

## 2022-07-14 DIAGNOSIS — Z79620 Long term (current) use of immunosuppressive biologic: Secondary | ICD-10-CM | POA: Diagnosis not present

## 2022-07-14 DIAGNOSIS — Z79631 Long term (current) use of antimetabolite agent: Secondary | ICD-10-CM | POA: Diagnosis not present

## 2022-07-14 DIAGNOSIS — M3130 Wegener's granulomatosis without renal involvement: Secondary | ICD-10-CM | POA: Diagnosis not present

## 2022-07-14 DIAGNOSIS — Z5181 Encounter for therapeutic drug level monitoring: Secondary | ICD-10-CM | POA: Diagnosis not present

## 2022-07-14 DIAGNOSIS — Z79899 Other long term (current) drug therapy: Secondary | ICD-10-CM | POA: Diagnosis not present

## 2022-07-14 DIAGNOSIS — M0579 Rheumatoid arthritis with rheumatoid factor of multiple sites without organ or systems involvement: Secondary | ICD-10-CM | POA: Diagnosis not present

## 2022-07-20 DIAGNOSIS — E890 Postprocedural hypothyroidism: Secondary | ICD-10-CM | POA: Diagnosis not present

## 2022-07-20 DIAGNOSIS — E7800 Pure hypercholesterolemia, unspecified: Secondary | ICD-10-CM | POA: Diagnosis not present

## 2022-07-25 DIAGNOSIS — E7800 Pure hypercholesterolemia, unspecified: Secondary | ICD-10-CM | POA: Diagnosis not present

## 2022-07-25 DIAGNOSIS — E890 Postprocedural hypothyroidism: Secondary | ICD-10-CM | POA: Diagnosis not present

## 2022-07-25 DIAGNOSIS — I10 Essential (primary) hypertension: Secondary | ICD-10-CM | POA: Diagnosis not present

## 2022-07-25 DIAGNOSIS — R7301 Impaired fasting glucose: Secondary | ICD-10-CM | POA: Diagnosis not present

## 2022-10-12 DIAGNOSIS — E890 Postprocedural hypothyroidism: Secondary | ICD-10-CM | POA: Diagnosis not present

## 2022-11-21 DIAGNOSIS — M3130 Wegener's granulomatosis without renal involvement: Secondary | ICD-10-CM | POA: Diagnosis not present

## 2023-01-04 DIAGNOSIS — Z79899 Other long term (current) drug therapy: Secondary | ICD-10-CM | POA: Diagnosis not present

## 2023-01-04 DIAGNOSIS — M3130 Wegener's granulomatosis without renal involvement: Secondary | ICD-10-CM | POA: Diagnosis not present

## 2023-01-04 DIAGNOSIS — M0579 Rheumatoid arthritis with rheumatoid factor of multiple sites without organ or systems involvement: Secondary | ICD-10-CM | POA: Diagnosis not present

## 2023-02-19 DIAGNOSIS — Z0000 Encounter for general adult medical examination without abnormal findings: Secondary | ICD-10-CM | POA: Diagnosis not present

## 2023-02-19 DIAGNOSIS — Z13220 Encounter for screening for lipoid disorders: Secondary | ICD-10-CM | POA: Diagnosis not present

## 2023-02-19 DIAGNOSIS — I10 Essential (primary) hypertension: Secondary | ICD-10-CM | POA: Diagnosis not present

## 2023-04-27 DIAGNOSIS — R7301 Impaired fasting glucose: Secondary | ICD-10-CM | POA: Diagnosis not present

## 2023-04-27 DIAGNOSIS — E7800 Pure hypercholesterolemia, unspecified: Secondary | ICD-10-CM | POA: Diagnosis not present

## 2023-04-27 DIAGNOSIS — E890 Postprocedural hypothyroidism: Secondary | ICD-10-CM | POA: Diagnosis not present

## 2023-04-27 DIAGNOSIS — I10 Essential (primary) hypertension: Secondary | ICD-10-CM | POA: Diagnosis not present

## 2023-05-21 DIAGNOSIS — M3130 Wegener's granulomatosis without renal involvement: Secondary | ICD-10-CM | POA: Diagnosis not present

## 2023-08-16 DIAGNOSIS — Z1231 Encounter for screening mammogram for malignant neoplasm of breast: Secondary | ICD-10-CM | POA: Diagnosis not present

## 2023-10-25 DIAGNOSIS — Z79899 Other long term (current) drug therapy: Secondary | ICD-10-CM | POA: Diagnosis not present

## 2023-10-25 DIAGNOSIS — M3130 Wegener's granulomatosis without renal involvement: Secondary | ICD-10-CM | POA: Diagnosis not present

## 2023-10-25 DIAGNOSIS — M0579 Rheumatoid arthritis with rheumatoid factor of multiple sites without organ or systems involvement: Secondary | ICD-10-CM | POA: Diagnosis not present

## 2023-11-19 DIAGNOSIS — Z01419 Encounter for gynecological examination (general) (routine) without abnormal findings: Secondary | ICD-10-CM | POA: Diagnosis not present

## 2023-11-19 DIAGNOSIS — Z124 Encounter for screening for malignant neoplasm of cervix: Secondary | ICD-10-CM | POA: Diagnosis not present

## 2023-11-19 DIAGNOSIS — Z113 Encounter for screening for infections with a predominantly sexual mode of transmission: Secondary | ICD-10-CM | POA: Diagnosis not present

## 2023-11-19 DIAGNOSIS — N951 Menopausal and female climacteric states: Secondary | ICD-10-CM | POA: Diagnosis not present

## 2024-02-08 DIAGNOSIS — Z79899 Other long term (current) drug therapy: Secondary | ICD-10-CM | POA: Diagnosis not present

## 2024-02-08 DIAGNOSIS — M3130 Wegener's granulomatosis without renal involvement: Secondary | ICD-10-CM | POA: Diagnosis not present

## 2024-03-14 DIAGNOSIS — E7800 Pure hypercholesterolemia, unspecified: Secondary | ICD-10-CM | POA: Diagnosis not present

## 2024-03-14 DIAGNOSIS — Z0000 Encounter for general adult medical examination without abnormal findings: Secondary | ICD-10-CM | POA: Diagnosis not present
